# Patient Record
Sex: Male | Born: 1950 | Race: White | Hispanic: No | Marital: Married | Smoking: Never smoker
Health system: Southern US, Community
[De-identification: ages and names within clinical notes are randomized; demographics above are authoritative.]

## PROBLEM LIST (undated history)

## (undated) DIAGNOSIS — E785 Hyperlipidemia, unspecified: Secondary | ICD-10-CM

## (undated) DIAGNOSIS — Z79899 Other long term (current) drug therapy: Secondary | ICD-10-CM

## (undated) DIAGNOSIS — E559 Vitamin D deficiency, unspecified: Secondary | ICD-10-CM

## (undated) DIAGNOSIS — I4891 Unspecified atrial fibrillation: Secondary | ICD-10-CM

## (undated) DIAGNOSIS — L570 Actinic keratosis: Secondary | ICD-10-CM

## (undated) DIAGNOSIS — R5383 Other fatigue: Secondary | ICD-10-CM

## (undated) DIAGNOSIS — Z7901 Long term (current) use of anticoagulants: Secondary | ICD-10-CM

## (undated) DIAGNOSIS — E669 Obesity, unspecified: Secondary | ICD-10-CM

## (undated) DIAGNOSIS — E349 Endocrine disorder, unspecified: Secondary | ICD-10-CM

## (undated) DIAGNOSIS — E66812 Obesity, class 2: Secondary | ICD-10-CM

## (undated) DIAGNOSIS — N529 Male erectile dysfunction, unspecified: Secondary | ICD-10-CM

## (undated) DIAGNOSIS — G44209 Tension-type headache, unspecified, not intractable: Secondary | ICD-10-CM

## (undated) HISTORY — PX: NASAL SEPTUM SURGERY: SHX37

## (undated) HISTORY — DX: Vitamin D deficiency, unspecified: E55.9

## (undated) HISTORY — DX: Endocrine disorder, unspecified: E34.9

## (undated) HISTORY — DX: Long term (current) use of anticoagulants: Z79.01

## (undated) HISTORY — DX: Other long term (current) drug therapy: Z79.899

## (undated) HISTORY — DX: Hyperlipidemia, unspecified: E78.5

## (undated) HISTORY — DX: Actinic keratosis: L57.0

## (undated) HISTORY — DX: Unspecified atrial fibrillation: I48.91

## (undated) HISTORY — DX: Obesity, unspecified: E66.9

## (undated) HISTORY — DX: Male erectile dysfunction, unspecified: N52.9

## (undated) HISTORY — DX: Obesity, class 2: E66.812

## (undated) HISTORY — DX: Other fatigue: R53.83

## (undated) HISTORY — DX: Tension-type headache, unspecified, not intractable: G44.209

---

## 2007-03-08 DIAGNOSIS — I482 Chronic atrial fibrillation, unspecified: Secondary | ICD-10-CM

## 2012-09-23 LAB — HM COLONOSCOPY: HM COLON: NORMAL

## 2013-12-16 LAB — LIPID PANEL
Cholesterol: 170 mg/dL (ref 0–200)
HDL: 42 mg/dL (ref 35–70)
LDL Cholesterol: 110 mg/dL
TRIGLYCERIDES: 89 mg/dL (ref 40–160)

## 2015-03-14 ENCOUNTER — Encounter: Payer: Self-pay | Admitting: Family Medicine

## 2015-03-14 DIAGNOSIS — Z5181 Encounter for therapeutic drug level monitoring: Secondary | ICD-10-CM | POA: Insufficient documentation

## 2015-03-14 DIAGNOSIS — E538 Deficiency of other specified B group vitamins: Secondary | ICD-10-CM | POA: Insufficient documentation

## 2015-03-14 DIAGNOSIS — E875 Hyperkalemia: Secondary | ICD-10-CM | POA: Insufficient documentation

## 2015-03-14 DIAGNOSIS — N529 Male erectile dysfunction, unspecified: Secondary | ICD-10-CM | POA: Insufficient documentation

## 2015-03-14 DIAGNOSIS — E291 Testicular hypofunction: Secondary | ICD-10-CM | POA: Insufficient documentation

## 2015-03-14 DIAGNOSIS — E785 Hyperlipidemia, unspecified: Secondary | ICD-10-CM | POA: Insufficient documentation

## 2015-03-14 DIAGNOSIS — E669 Obesity, unspecified: Secondary | ICD-10-CM | POA: Insufficient documentation

## 2015-03-14 DIAGNOSIS — E559 Vitamin D deficiency, unspecified: Secondary | ICD-10-CM | POA: Insufficient documentation

## 2015-03-14 DIAGNOSIS — G44209 Tension-type headache, unspecified, not intractable: Secondary | ICD-10-CM | POA: Insufficient documentation

## 2015-03-14 DIAGNOSIS — D229 Melanocytic nevi, unspecified: Secondary | ICD-10-CM | POA: Insufficient documentation

## 2015-03-15 ENCOUNTER — Encounter: Payer: Self-pay | Admitting: Family Medicine

## 2015-03-15 ENCOUNTER — Ambulatory Visit (INDEPENDENT_AMBULATORY_CARE_PROVIDER_SITE_OTHER): Payer: Managed Care, Other (non HMO) | Admitting: Family Medicine

## 2015-03-15 VITALS — BP 124/86 | HR 83 | Temp 98.4°F | Resp 18 | Ht 69.5 in | Wt 247.2 lb

## 2015-03-15 DIAGNOSIS — F39 Unspecified mood [affective] disorder: Secondary | ICD-10-CM | POA: Diagnosis not present

## 2015-03-15 DIAGNOSIS — J40 Bronchitis, not specified as acute or chronic: Secondary | ICD-10-CM

## 2015-03-15 DIAGNOSIS — F338 Other recurrent depressive disorders: Secondary | ICD-10-CM

## 2015-03-15 DIAGNOSIS — E538 Deficiency of other specified B group vitamins: Secondary | ICD-10-CM | POA: Diagnosis not present

## 2015-03-15 DIAGNOSIS — I480 Paroxysmal atrial fibrillation: Secondary | ICD-10-CM

## 2015-03-15 DIAGNOSIS — E669 Obesity, unspecified: Secondary | ICD-10-CM

## 2015-03-15 MED ORDER — BENZONATATE 200 MG PO CAPS: 200.0000 mg | ORAL_CAPSULE | Freq: Three times a day (TID) | ORAL | Status: DC

## 2015-03-15 MED ORDER — BUPROPION HCL ER (XL) 300 MG PO TB24: 300.0000 mg | ORAL_TABLET | Freq: Every day | ORAL | Status: DC

## 2015-03-15 NOTE — Patient Instructions (Signed)
Bupropion tablets (Depression/Mood Disorders) What is this medicine? BUPROPION (byoo PROE pee on) is used to treat depression. This medicine may be used for other purposes; ask your health care provider or pharmacist if you have questions. COMMON BRAND NAME(S): Wellbutrin What should I tell my health care provider before I take this medicine? They need to know if you have any of these conditions: -an eating disorder, such as anorexia or bulimia -bipolar disorder or psychosis -diabetes or high blood sugar, treated with medication -glaucoma -heart disease, previous heart attack, or irregular heart beat -head injury or brain tumor -high blood pressure -kidney or liver disease -seizures -suicidal thoughts or a previous suicide attempt -Tourette's syndrome -weight loss -an unusual or allergic reaction to bupropion, other medicines, foods, dyes, or preservatives -breast-feeding -pregnant or trying to become pregnant How should I use this medicine? Take this medicine by mouth with a glass of water. Follow the directions on the prescription label. You can take it with or without food. If it upsets your stomach, take it with food. Take your medicine at regular intervals. Do not take your medicine more often than directed. Do not stop taking this medicine suddenly except upon the advice of your doctor. Stopping this medicine too quickly may cause serious side effects or your condition may worsen. A special MedGuide will be given to you by the pharmacist with each prescription and refill. Be sure to read this information carefully each time. Talk to your pediatrician regarding the use of this medicine in children. Special care may be needed. Overdosage: If you think you have taken too much of this medicine contact a poison control center or emergency room at once. NOTE: This medicine is only for you. Do not share this medicine with others. What if I miss a dose? If you miss a dose, take it as soon  as you can. If it is less than four hours to your next dose, take only that dose and skip the missed dose. Do not take double or extra doses. What may interact with this medicine? Do not take this medicine with any of the following medications: -linezolid -MAOIs like Azilect, Carbex, Eldepryl, Marplan, Nardil, and Parnate -methylene blue (injected into a vein) -other medicines that contain bupropion like Zyban Acute Bronchitis Bronchitis is inflammation of the airways that extend from the windpipe into the lungs (bronchi). The inflammation often causes mucus to develop. This leads to a cough, which is the most common symptom of bronchitis.  In acute bronchitis, the condition usually develops suddenly and goes away over time, usually in a couple weeks. Smoking, allergies, and asthma can make bronchitis worse. Repeated episodes of bronchitis may cause further lung problems.  CAUSES Acute bronchitis is most often caused by the same virus that causes a cold. The virus can spread from person to person (contagious) through coughing, sneezing, and touching contaminated objects. SIGNS AND SYMPTOMS   Cough.   Fever.   Coughing up mucus.   Body aches.   Chest congestion.   Chills.   Shortness of breath.   Sore throat.  DIAGNOSIS  Acute bronchitis is usually diagnosed through a physical exam. Your health care provider will also ask you questions about your medical history. Tests, such as chest X-rays, are sometimes done to rule out other conditions.  TREATMENT  Acute bronchitis usually goes away in a couple weeks. Oftentimes, no medical treatment is necessary. Medicines are sometimes given for relief of fever or cough. Antibiotic medicines are usually not needed but may  be prescribed in certain situations. In some cases, an inhaler may be recommended to help reduce shortness of breath and control the cough. A cool mist vaporizer may also be used to help thin bronchial secretions and make  it easier to clear the chest.  HOME CARE INSTRUCTIONS  Get plenty of rest.   Drink enough fluids to keep your urine clear or pale yellow (unless you have a medical condition that requires fluid restriction). Increasing fluids may help thin your respiratory secretions (sputum) and reduce chest congestion, and it will prevent dehydration.   Take medicines only as directed by your health care provider.  If you were prescribed an antibiotic medicine, finish it all even if you start to feel better.  Avoid smoking and secondhand smoke. Exposure to cigarette smoke or irritating chemicals will make bronchitis worse. If you are a smoker, consider using nicotine gum or skin patches to help control withdrawal symptoms. Quitting smoking will help your lungs heal faster.   Reduce the chances of another bout of acute bronchitis by washing your hands frequently, avoiding people with cold symptoms, and trying not to touch your hands to your mouth, nose, or eyes.   Keep all follow-up visits as directed by your health care provider.  SEEK MEDICAL CARE IF: Your symptoms do not improve after 1 week of treatment.  SEEK IMMEDIATE MEDICAL CARE IF:  You develop an increased fever or chills.   You have chest pain.   You have severe shortness of breath.  You have bloody sputum.   You develop dehydration.  You faint or repeatedly feel like you are going to pass out.  You develop repeated vomiting.  You develop a severe headache. MAKE SURE YOU:   Understand these instructions.  Will watch your condition.  Will get help right away if you are not doing well or get worse. Document Released: 10/23/2004 Document Revised: 01/30/2014 Document Reviewed: 03/08/2013 [facility] Patient Information 2015 [place], [place]. This information is not intended to replace advice given to you by your health care provider. Make sure you discuss any questions you have with your health care provider.

## 2015-03-15 NOTE — Progress Notes (Signed)
Name: James Newman   MRN: [id number]    DOB: 1950-09-30   Date:03/15/2015       Progress Note  Subjective  Chief Complaint  Chief Complaint  Patient presents with  . Nasal Congestion    Patient went to [facility] and was given Amoxicillin, Prednisone,  Albuterol syrup, and Flonase on Friday March 09, 2015   . Cough    Onset- 3 weeks, yellow mucus, worst when lays down.    HPI  URI: developed nasal congestion, postnasal drainage, productive cough. Symptoms started a few weeks ago.  He went to [facility] on June 10th, 2016,  he took Prednisone, flonase, albuterol syrup and over the weekend cough got worse so he filled antibiotics 48 hours ago. He is feeling better overall, but he came in to make sure he is okay.   Lack of energy: here was doing well on Androgel, but HCT was up and had to stop it, he gained weight and last fall started to have lack of energy and motivation. He was taking Wellbutrin and helped him, he wants a refill  Afib: no chest pain, no palpitation, taking coumadin and is doing well, rhythm controlled  Patient Active Problem List   Diagnosis Date Noted  . Encounter for therapeutic drug level monitoring 03/14/2015  . 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 B12 deficiency 03/14/2015  . Dyslipidemia 03/14/2015  . 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 Obesity (BMI 30-39.9) 03/14/2015  . Vitamin D deficiency 03/14/2015  . A-fib 03/08/2007    Past Surgical History  Procedure Laterality Date  . Nasal septum surgery      Family History  Problem Relation Age of Onset  . COPD Mother   . Hypertension Son   . Obesity Son     History   Social History  . Marital Status: Married    Spouse Name: N/A  . Number of Children: N/A  . Years of Education: N/A   Occupational History  . Not on file.   Social History Main Topics  . Smoking status: Never Smoker   . Smokeless tobacco: Never Used   . Alcohol Use: 1.2 oz/week    2 Standard drinks or equivalent per week  . Drug Use: No  . Sexual Activity:    Partners: Female   Other Topics Concern  . Not on file   Social History Narrative     Current outpatient prescriptions:  .  albuterol (PROVENTIL,VENTOLIN) 2 MG/5ML syrup, Take 2 mg by mouth 4 (four) times daily as needed., Disp: , Rfl:  .  amoxicillin (AMOXIL) 500 MG capsule, Take 1 capsule by mouth 3 (three) times daily., Disp: , Rfl:  .  buPROPion (WELLBUTRIN XL) 300 MG 24 hr tablet, Take 1 tablet by mouth daily., Disp: , Rfl:  .  Cyanocobalamin (B-12) 1000 MCG SUBL, Place 1 tablet under the tongue daily., Disp: , Rfl:  .  fluticasone (FLONASE) 50 MCG/ACT nasal spray, Place 2 sprays into both nostrils as needed., Disp: , Rfl:  .  warfarin (COUMADIN) 5 MG tablet, Take 1 tablet by mouth daily., Disp: , Rfl:   No Known Allergies   ROS  Constitutional: Negative for fever but has  weight change.  Respiratory: positive for cough no shortness of breath.   Cardiovascular: Negative for chest pain or palpitations.  Gastrointestinal: Negative for abdominal pain, no bowel changes.  Musculoskeletal: Negative for gait problem or joint swelling.  Skin: Negative for rash.  Neurological: Negative for dizziness or headache.  No other specific complaints in a complete review of systems (except as listed in HPI above).  Objective  Filed Vitals:   03/15/15 1018  BP: 124/86  Pulse: 83  Temp: 98.4 F (36.9 C)  TempSrc: Oral  Resp: 18  Height: 5' 9.5" (1.765 m)  Weight: 247 lb 3.2 oz (112.129 kg)  SpO2: 98%    Body mass index is 35.99 kg/(m^2).  Physical Exam  Constitutional: Patient appears well-developed and well-nourished. No distress.  Eyes:  No scleral icterus.  Neck: Normal range of motion. Neck supple. Head atraumatic, normocephalic Ears : within normal limits Cardiovascular: irregular rate and rhythm  No murmur heard. No BLE edema. Pulmonary/Chest: Effort  normal and breath sounds normal. No respiratory distress. Abdominal: Soft.  There is no tenderness. Psychiatric: Patient has a normal mood and affect. behavior is normal. Judgment and thought content normal.  PHQ2/9: Depression screen PHQ 2/9 03/15/2015  Decreased Interest 0  Down, Depressed, Hopeless 0  PHQ - 2 Score 0     Fall Risk: Fall Risk  03/15/2015  Falls in the past year? No    Assessment & Plan  1. Bronchitis Continue all medications, add cough suppressant - benzonatate (TESSALON) 200 MG capsule; Take 1 capsule (200 mg total) by mouth 3 (three) times daily.  Dispense: 15 capsule; Refill: 45  2. Paroxysmal atrial fibrillation  Stop albuterol syrup because of afib  3. B12 deficiency Taking supplementation  4. Obesity (BMI 30-39.9) Gained weight since stopped Androgel one year ago  5. Seasonal affective disorder  - buPROPion (WELLBUTRIN XL) 300 MG 24 hr tablet; Take 1 tablet (300 mg total) by mouth daily.  Dispense: 30 tablet; Refill: 5

## 2015-05-15 ENCOUNTER — Other Ambulatory Visit: Payer: Self-pay

## 2015-05-15 NOTE — Telephone Encounter (Signed)
Patient requesting refill. 

## 2015-05-16 MED ORDER — WARFARIN SODIUM 5 MG PO TABS: 5.0000 mg | ORAL_TABLET | Freq: Every day | ORAL | Status: DC

## 2015-06-08 ENCOUNTER — Other Ambulatory Visit: Payer: Self-pay | Admitting: Family Medicine

## 2015-06-09 LAB — PROTIME-INR
INR: 2.2 — ABNORMAL HIGH (ref 0.8–1.2)
PROTHROMBIN TIME: 22.9 s — AB (ref 9.1–12.0)

## 2015-06-11 NOTE — Progress Notes (Signed)
Patient notified

## 2015-08-22 ENCOUNTER — Telehealth: Payer: Self-pay | Admitting: Family Medicine

## 2015-08-22 NOTE — Telephone Encounter (Signed)
Patient scheduled his annual cpe for Wednesday 08-29-15. He would like to pick up a lab order on Monday 08-27-15 that way the test results will be in on his physical date.

## 2015-08-23 ENCOUNTER — Other Ambulatory Visit: Payer: Self-pay | Admitting: Family Medicine

## 2015-08-23 DIAGNOSIS — E538 Deficiency of other specified B group vitamins: Secondary | ICD-10-CM

## 2015-08-23 DIAGNOSIS — Z5181 Encounter for therapeutic drug level monitoring: Secondary | ICD-10-CM

## 2015-08-23 DIAGNOSIS — N529 Male erectile dysfunction, unspecified: Secondary | ICD-10-CM

## 2015-08-23 DIAGNOSIS — E785 Hyperlipidemia, unspecified: Secondary | ICD-10-CM

## 2015-08-23 DIAGNOSIS — E559 Vitamin D deficiency, unspecified: Secondary | ICD-10-CM

## 2015-08-23 NOTE — Telephone Encounter (Signed)
orderd

## 2015-08-29 ENCOUNTER — Ambulatory Visit (INDEPENDENT_AMBULATORY_CARE_PROVIDER_SITE_OTHER): Payer: Managed Care, Other (non HMO) | Admitting: Family Medicine

## 2015-08-29 ENCOUNTER — Other Ambulatory Visit: Payer: Self-pay | Admitting: Family Medicine

## 2015-08-29 ENCOUNTER — Encounter: Payer: Self-pay | Admitting: Family Medicine

## 2015-08-29 VITALS — BP 122/84 | HR 82 | Temp 97.6°F | Resp 14 | Ht 72.0 in | Wt 246.5 lb

## 2015-08-29 DIAGNOSIS — Z719 Counseling, unspecified: Secondary | ICD-10-CM

## 2015-08-29 DIAGNOSIS — Z0000 Encounter for general adult medical examination without abnormal findings: Secondary | ICD-10-CM

## 2015-08-29 DIAGNOSIS — Z7189 Other specified counseling: Secondary | ICD-10-CM | POA: Diagnosis not present

## 2015-08-29 DIAGNOSIS — Z125 Encounter for screening for malignant neoplasm of prostate: Secondary | ICD-10-CM

## 2015-08-29 NOTE — Progress Notes (Signed)
Name: James Newman   MRN: [id number]    DOB: April 19, 1951   Date:08/29/2015       Progress Note  Subjective  Chief Complaint  Chief Complaint  Patient presents with  . Annual Exam    HPI  Male Exam: he continues to have fatigue and weight gain since stopped taking Testosterone a couple of years ago because HCT was elevated, he wants to have labs rechecked and reconsider going back on medication. He is taking Vitamin B12 and Vitamin D otc , levels were very low in January 2016 He denies any symptoms of BPH such as weak urinary stream, dribbling or nocturia   Patient Active Problem List   Diagnosis Date Noted  . Encounter for therapeutic drug level monitoring 03/14/2015  . 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 B12 deficiency 03/14/2015  . Dyslipidemia 03/14/2015  . 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 Obesity (BMI 30-39.9) 03/14/2015  . Vitamin D deficiency 03/14/2015  . A-fib ([place]) 03/08/2007    Past Surgical History  Procedure Laterality Date  . Nasal septum surgery      Family History  Problem Relation Age of Onset  . COPD Mother   . Hypertension Son   . Obesity Son     Social History   Social History  . Marital Status: Married    Spouse Name: N/A  . Number of Children: N/A  . Years of Education: N/A   Occupational History  . Not on file.   Social History Main Topics  . Smoking status: Never Smoker   . Smokeless tobacco: Never Used  . Alcohol Use: 1.2 oz/week    2 Standard drinks or equivalent per week  . Drug Use: No  . Sexual Activity:    Partners: Female   Other Topics Concern  . Not on file   Social History Narrative     Current outpatient prescriptions:  .  cholecalciferol (VITAMIN D) 1000 UNITS tablet, Take 1,000 Units by mouth daily., Disp: , Rfl:  .  Cyanocobalamin (B-12) 1000 MCG SUBL, Place 1 tablet under the tongue daily., Disp: , Rfl:  .  warfarin (COUMADIN) 5 MG  tablet, Take 1 tablet (5 mg total) by mouth daily. Take 1 tablet by mouth daily and 1& 1/2 tablets on Tuesday and Thursday, Disp: 45 tablet, Rfl: 5  No Known Allergies   ROS  Constitutional: Negative for fever or weight change.  Respiratory: Negative for cough and shortness of breath.   Cardiovascular: Negative for chest pain or palpitations.  Gastrointestinal: Negative for abdominal pain, no bowel changes.  Musculoskeletal: Negative for gait problem or joint swelling.  Skin: Negative for rash.  Neurological: Negative for dizziness or headache.  No other specific complaints in a complete review of systems (except as listed in HPI above).   Objective  Filed Vitals:   08/29/15 0857  BP: 122/84  Pulse: 82  Temp: 97.6 F (36.4 C)  TempSrc: Oral  Resp: 14  Height: 6' (1.829 m)  Weight: 246 lb 8 oz (111.812 kg)  SpO2: 97%    Body mass index is 33.42 kg/(m^2).  Physical Exam  Constitutional: Patient appears well-developed and well-nourished. No distress.  HENT: Head: Normocephalic and atraumatic. Ears: B TMs ok, no erythema or effusion; Nose: Nose normal. Mouth/Throat: Oropharynx is clear and moist. No oropharyngeal exudate.  Eyes: Conjunctivae and EOM are normal. Pupils are equal, round, and reactive to light. No scleral icterus.  Neck: Normal range of  motion. Neck supple. No JVD present. No thyromegaly present.  Cardiovascular: irregular rate and  rhythm and normal heart sounds.  No murmur heard. No BLE edema. Pulmonary/Chest: Effort normal and breath sounds normal. No respiratory distress. Abdominal: Soft. Bowel sounds are normal, no distension. There is no tenderness. no masses MALE GENITALIA: Normal descended testes bilaterally, no masses palpated, no hernias, no lesions, no discharge RECTAL: patient did not want to have it done this year Musculoskeletal: Normal range of motion, no joint effusions. No gross deformities Neurological: he is alert and oriented to person,  place, and time. No cranial nerve deficit. Coordination, balance, strength, speech and gait are normal.  Skin: Skin is warm and very dry, pilling on his face, also thick toenails, and hyperpigmentation on both anterior legs Psychiatric: Patient has a normal mood and affect. behavior is normal. Judgment and thought content normal.  Recent Results (from the past 2160 hour(s))  Protime-INR     Status: Abnormal   Collection Time: 06/08/15  2:41 PM  Result Value Ref Range   INR 2.2 (H) 0.8 - 1.2    Comment: Reference interval is for non-anticoagulated patients. Suggested INR therapeutic range for Vitamin K antagonist therapy:    Standard Dose (moderate intensity                   therapeutic range):       2.0 - 3.0    Higher intensity therapeutic range       2.5 - 3.5    Prothrombin Time 22.9 (H) 9.1 - 12.0 sec    PHQ2/9: Depression screen [facility] 2/9 08/29/2015 03/15/2015  Decreased Interest 0 0  Down, Depressed, Hopeless 0 0  PHQ - 2 Score 0 0    Fall Risk: Fall Risk  08/29/2015 03/15/2015  Falls in the past year? No No    Functional Status Survey: Is the patient deaf or have difficulty hearing?: No Does the patient have difficulty seeing, even when wearing glasses/contacts?: No Does the patient have difficulty concentrating, remembering, or making decisions?: No Does the patient have difficulty walking or climbing stairs?: No Does the patient have difficulty dressing or bathing?: No Does the patient have difficulty doing errands alone such as visiting a doctor's office or shopping?: No    Assessment & Plan  1. Encounter for routine history and physical exam for male   2. Prostate cancer screening  Discussed USPTF but he would like to have PSA this year - PSA  3. Health counseling  Discussed importance of 150 minutes of physical activity weekly, eat two servings of fish weekly, eat one serving of tree nuts ( cashews, pistachios, pecans, almonds.Marland Kitchen) every other day, eat 6  servings of fruit/vegetables daily and drink plenty of water and avoid sweet beverages.

## 2015-08-30 ENCOUNTER — Other Ambulatory Visit: Payer: Self-pay | Admitting: Family Medicine

## 2015-08-30 ENCOUNTER — Telehealth: Payer: Self-pay

## 2015-08-30 DIAGNOSIS — E291 Testicular hypofunction: Secondary | ICD-10-CM

## 2015-08-30 LAB — LIPID PANEL
CHOL/HDL RATIO: 4 ratio (ref 0.0–5.0)
Cholesterol, Total: 206 mg/dL — ABNORMAL HIGH (ref 100–199)
HDL: 52 mg/dL (ref >39)
LDL CALC: 133 mg/dL — AB (ref 0–99)
Triglycerides: 107 mg/dL (ref 0–149)
VLDL Cholesterol Cal: 21 mg/dL (ref 5–40)

## 2015-08-30 LAB — COMPREHENSIVE METABOLIC PANEL
ALBUMIN: 4.4 g/dL (ref 3.6–4.8)
ALT: 17 IU/L (ref 0–44)
AST: 22 IU/L (ref 0–40)
Albumin/Globulin Ratio: 1.6 (ref 1.1–2.5)
Alkaline Phosphatase: 54 IU/L (ref 39–117)
BUN/Creatinine Ratio: 15 (ref 10–22)
BUN: 16 mg/dL (ref 8–27)
Bilirubin Total: 1.2 mg/dL (ref 0.0–1.2)
CALCIUM: 9.8 mg/dL (ref 8.6–10.2)
CO2: 24 mmol/L (ref 18–29)
CREATININE: 1.06 mg/dL (ref 0.76–1.27)
Chloride: 100 mmol/L (ref 97–106)
GFR, EST AFRICAN AMERICAN: 87 mL/min/{1.73_m2} (ref >59)
GFR, EST NON AFRICAN AMERICAN: 75 mL/min/{1.73_m2} (ref >59)
GLOBULIN, TOTAL: 2.8 g/dL (ref 1.5–4.5)
Glucose: 98 mg/dL (ref 65–99)
Potassium: 5.8 mmol/L — ABNORMAL HIGH (ref 3.5–5.2)
SODIUM: 139 mmol/L (ref 136–144)
TOTAL PROTEIN: 7.2 g/dL (ref 6.0–8.5)

## 2015-08-30 LAB — CBC
Hematocrit: 47.1 % (ref 37.5–51.0)
Hemoglobin: 16.2 g/dL (ref 12.6–17.7)
MCH: 30.3 pg (ref 26.6–33.0)
MCHC: 34.4 g/dL (ref 31.5–35.7)
MCV: 88 fL (ref 79–97)
PLATELETS: 174 10*3/uL (ref 150–379)
RBC: 5.34 x10E6/uL (ref 4.14–5.80)
RDW: 13.8 % (ref 12.3–15.4)
WBC: 7.2 10*3/uL (ref 3.4–10.8)

## 2015-08-30 LAB — VITAMIN D 25 HYDROXY (VIT D DEFICIENCY, FRACTURES): VIT D 25 HYDROXY: 22.1 ng/mL — AB (ref 30.0–100.0)

## 2015-08-30 LAB — PROTIME-INR
INR: 1.2 (ref 0.8–1.2)
PROTHROMBIN TIME: 12.8 s — AB (ref 9.1–12.0)

## 2015-08-30 LAB — PSA: Prostate Specific Ag, Serum: 1.4 ng/mL (ref 0.0–4.0)

## 2015-08-30 LAB — TESTOSTERONE: Testosterone: 323 ng/dL — ABNORMAL LOW (ref 348–1197)

## 2015-08-30 LAB — VITAMIN B12: Vitamin B-12: 744 pg/mL (ref 211–946)

## 2015-08-30 MED ORDER — TESTOSTERONE 12.5 MG/ACT (1%) TD GEL: 50.0000 mg | Freq: Every day | TRANSDERMAL | Status: DC

## 2015-09-07 NOTE — Telephone Encounter (Signed)
Open in erro 

## 2015-10-29 ENCOUNTER — Other Ambulatory Visit: Payer: Self-pay | Admitting: Family Medicine

## 2015-10-29 NOTE — Telephone Encounter (Signed)
Patient requesting refill. 

## 2015-11-22 ENCOUNTER — Encounter: Payer: Self-pay | Admitting: Family Medicine

## 2015-11-22 ENCOUNTER — Ambulatory Visit (INDEPENDENT_AMBULATORY_CARE_PROVIDER_SITE_OTHER): Payer: Managed Care, Other (non HMO) | Admitting: Family Medicine

## 2015-11-22 VITALS — BP 128/80 | HR 97 | Temp 97.7°F | Resp 16 | Ht 72.0 in | Wt 255.1 lb

## 2015-11-22 DIAGNOSIS — E785 Hyperlipidemia, unspecified: Secondary | ICD-10-CM

## 2015-11-22 DIAGNOSIS — I480 Paroxysmal atrial fibrillation: Secondary | ICD-10-CM | POA: Diagnosis not present

## 2015-11-22 DIAGNOSIS — E538 Deficiency of other specified B group vitamins: Secondary | ICD-10-CM

## 2015-11-22 DIAGNOSIS — E669 Obesity, unspecified: Secondary | ICD-10-CM | POA: Diagnosis not present

## 2015-11-22 DIAGNOSIS — E291 Testicular hypofunction: Secondary | ICD-10-CM

## 2015-11-22 DIAGNOSIS — F39 Unspecified mood [affective] disorder: Secondary | ICD-10-CM | POA: Diagnosis not present

## 2015-11-22 DIAGNOSIS — D692 Other nonthrombocytopenic purpura: Secondary | ICD-10-CM

## 2015-11-22 DIAGNOSIS — Z79899 Other long term (current) drug therapy: Secondary | ICD-10-CM

## 2015-11-22 DIAGNOSIS — F338 Other recurrent depressive disorders: Secondary | ICD-10-CM | POA: Insufficient documentation

## 2015-11-22 NOTE — Progress Notes (Addendum)
Name: James Newman   MRN: [id number]    DOB: [birth year]   Date:11/22/2015       Progress Note  Subjective  Chief Complaint  Chief Complaint  Patient presents with  . Follow-up    patient is here for his 63-month f/u  . 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 He states testosterone seemed to have improved his energy level also.   Hypogonadism: he has been doing well on Testosterone gel, more energy, we will recheck since he has history of elevated of hgb while of medication in the past. Denies symptoms of aggression. Good level of energy. No libido - but states his wife has been sick lately also.  Afib: paroxysmal. He has been taking coumadin, denies easy bleeding. He is compliant with having labs done. No recent episodes of SOB or palpitation.   Vitamin D and B12: taking otc supplementation   Patient Active Problem List   Diagnosis Date Noted  . Seasonal affective disorder ([place]) 11/22/2015  . Encounter for therapeutic drug level monitoring 03/14/2015  . 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 B12 deficiency 03/14/2015  . Dyslipidemia 03/14/2015  . 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 Obesity (BMI 30-39.9) 03/14/2015  . Vitamin D deficiency 03/14/2015  . A-fib ([place]) 03/08/2007    Past Surgical History  Procedure Laterality Date  . Nasal septum surgery      Family History  Problem Relation Age of Onset  . COPD Mother   . Hypertension Son   . Obesity Son     Social History   Social History  . Marital Status: Married    Spouse Name: N/A  . Number of Children: N/A  . Years of Education: N/A   Occupational History  . Not on file.   Social History Main Topics  . Smoking status: Never Smoker   . Smokeless  tobacco: Never Used  . Alcohol Use: 1.2 oz/week    2 Standard drinks or equivalent per week  . Drug Use: No  . Sexual Activity:    Partners: Female   Other Topics Concern  . Not on file   Social History Narrative     Current outpatient prescriptions:  .  cholecalciferol (VITAMIN D) 1000 UNITS tablet, Take 2,000 Units by mouth daily., Disp: , Rfl:  .  Cyanocobalamin (B-12) 1000 MCG SUBL, Place 1 tablet under the tongue daily., Disp: , Rfl:  .  Testosterone 12.5 MG/ACT (1%) GEL, Place 50 mg onto the skin daily., Disp: 150 g, Rfl: 0 .  warfarin (COUMADIN) 5 MG tablet, Take 1 tablet (5 mg total) by mouth daily. Take 1 tablet by mouth daily and 1& 1/2 tablets on Tuesday and Thursday, Disp: 45 tablet, Rfl: 5  No Known Allergies   ROS  Constitutional: Negative for fever , positive for  weight change.  Respiratory: Negative for cough and shortness of breath.   Cardiovascular: Negative for chest pain or palpitations.  Gastrointestinal: Negative for abdominal pain, no bowel changes.  Musculoskeletal: Negative for gait problem or joint swelling.  Skin: Negative for rash.  Neurological: Negative for dizziness or headache.  No other specific complaints in a complete review of systems (except as listed in HPI above).  Objective  Filed Vitals:  11/22/15 0810  BP: 128/80  Pulse: 97  Temp: 97.7 F (36.5 C)  TempSrc: Oral  Resp: 16  Height: 6' (1.829 m)  Weight: 255 lb 1.6 oz (115.713 kg)  SpO2: 97%    Body mass index is 34.59 kg/(m^2).  Physical Exam  Constitutional: Patient appears well-developed and well-nourished. Obese No distress.  HEENT: head atraumatic, normocephalic, pupils equal and reactive to light, neck supple, throat within normal limits Cardiovascular:  Irregular rate and  rhythm and normal heart sounds.  No murmur heard. No BLE edema. Pulmonary/Chest: Effort normal and breath sounds normal. No respiratory distress. Abdominal: Soft.  There is no  tenderness. Psychiatric: Patient has a normal mood and affect. behavior is normal. Judgment and thought content normal. Skin: ecchymosis on left upper arms  Recent Results (from the past 2160 hour(s))  PSA     Status: None   Collection Time: 08/29/15  9:47 AM  Result Value Ref Range   Prostate Specific Ag, Serum 1.4 0.0 - 4.0 ng/mL    Comment: Roche ECLIA methodology. According to the American Urological Association, Serum PSA should decrease and remain at undetectable levels after radical prostatectomy. The AUA defines biochemical recurrence as an initial PSA value 0.2 ng/mL or greater followed by a subsequent confirmatory PSA value 0.2 ng/mL or greater. Values obtained with different assay methods or kits cannot be used interchangeably. Results cannot be interpreted as absolute evidence of the presence or absence of malignant disease.   Comprehensive metabolic panel     Status: Abnormal   Collection Time: 08/29/15  9:47 AM  Result Value Ref Range   Glucose 98 65 - 99 mg/dL   BUN 16 8 - 27 mg/dL   Creatinine, Ser 1.06 0.76 - 1.27 mg/dL   GFR calc non Af Amer 75 >59 mL/min/1.73   GFR calc Af Amer 87 >59 mL/min/1.73   BUN/Creatinine Ratio 15 10 - 22   Sodium 139 136 - 144 mmol/L    Comment: **Effective September 10, 2015 the reference interval**   for Sodium, Serum will be changing to:                                             134 - 144    Potassium 5.8 (H) 3.5 - 5.2 mmol/L   Chloride 100 97 - 106 mmol/L    Comment: **Effective September 10, 2015 the reference interval**   for Chloride, Serum will be changing to:                                              96 - 106    CO2 24 18 - 29 mmol/L   Calcium 9.8 8.6 - 10.2 mg/dL   Total Protein 7.2 6.0 - 8.5 g/dL   Albumin 4.4 3.6 - 4.8 g/dL   Globulin, Total 2.8 1.5 - 4.5 g/dL   Albumin/Globulin Ratio 1.6 1.1 - 2.5   Bilirubin Total 1.2 0.0 - 1.2 mg/dL   Alkaline Phosphatase 54 39 - 117 IU/L   AST 22 0 - 40 IU/L   ALT 17 0 -  44 IU/L  CBC     Status: None   Collection Time: 08/29/15  9:47 AM  Result Value Ref Range   WBC 7.2 3.4 -  10.8 x10E3/uL   RBC 5.34 4.14 - 5.80 x10E6/uL   Hemoglobin 16.2 12.6 - 17.7 g/dL   Hematocrit 47.1 37.5 - 51.0 %   MCV 88 79 - 97 fL   MCH 30.3 26.6 - 33.0 pg   MCHC 34.4 31.5 - 35.7 g/dL   RDW 13.8 12.3 - 15.4 %   Platelets 174 150 - 379 x10E3/uL  Lipid panel     Status: Abnormal   Collection Time: 08/29/15  9:47 AM  Result Value Ref Range   Cholesterol, Total 206 (H) 100 - 199 mg/dL   Triglycerides 107 0 - 149 mg/dL   HDL 52 >39 mg/dL   VLDL Cholesterol Cal 21 5 - 40 mg/dL   LDL Calculated 133 (H) 0 - 99 mg/dL   Chol/HDL Ratio 4.0 0.0 - 5.0 ratio units    Comment:                                   T. Chol/HDL Ratio                                             Men  Women                               1/2 Avg.Risk  3.4    3.3                                   Avg.Risk  5.0    4.4                                2X Avg.Risk  9.6    7.1                                3X Avg.Risk 23.4   11.0   Testosterone     Status: Abnormal   Collection Time: 08/29/15  9:47 AM  Result Value Ref Range   Testosterone 323 (L) 348 - 1197 ng/dL   Comment, Testosterone Comment     Comment: Adult male reference interval is based on a population of lean males up to 63 years old.   VITAMIN D 25 Hydroxy (Vit-D Deficiency, Fractures)     Status: Abnormal   Collection Time: 08/29/15  9:47 AM  Result Value Ref Range   Vit D, 25-Hydroxy 22.1 (L) 30.0 - 100.0 ng/mL    Comment: Vitamin D deficiency has been defined by the [place] practice guideline as a level of serum 25-OH vitamin D less than 20 ng/mL (1,2). The Endocrine Society went on to further define vitamin D insufficiency as a level between 21 and 29 ng/mL (2). 1. IOM (Institute of Medicine). 2010. Dietary reference    intakes for calcium and D. [place]: The    [employer]. 2.  Holick MF, [place], Bischoff-Ferrari HA, et al.    Evaluation, treatment, and prevention of vitamin D    deficiency: an Endocrine Society clinical practice    guideline. JCEM. 2011 Jul; 96(7):1911-30.  Vitamin B12     Status: None   Collection Time: 08/29/15  9:47 AM  Result Value Ref Range   Vitamin B-12 744 211 - 946 pg/mL  Protime-INR     Status: Abnormal   Collection Time: 08/29/15  9:52 AM  Result Value Ref Range   INR 1.2 0.8 - 1.2    Comment: Reference interval is for non-anticoagulated patients. Suggested INR therapeutic range for Vitamin K antagonist therapy:    Standard Dose (moderate intensity                   therapeutic range):       2.0 - 3.0    Higher intensity therapeutic range       2.5 - 3.5    Prothrombin Time 12.8 (H) 9.1 - 12.0 sec     PHQ2/9: Depression screen [facility] 2/9 11/22/2015 08/29/2015 03/15/2015  Decreased Interest 0 0 0  Down, Depressed, Hopeless 0 0 0  PHQ - 2 Score 0 0 0    Fall Risk: Fall Risk  11/22/2015 08/29/2015 03/15/2015  Falls in the past year? No No No    Functional Status Survey: Is the patient deaf or have difficulty hearing?: No Does the patient have difficulty seeing, even when wearing glasses/contacts?: No Does the patient have difficulty concentrating, remembering, or making decisions?: No Does the patient have difficulty walking or climbing stairs?: No Does the patient have difficulty dressing or bathing?: No Does the patient have difficulty doing errands alone such as visiting a doctor's office or shopping?: No    Assessment & Plan  1. Paroxysmal atrial fibrillation (HCC)  Doing well   2. B12 deficiency  Continue otc supplementation   3. Obesity (BMI 30-39.9)  Discussed with the patient the risk posed by an increased BMI. Discussed importance of portion control, calorie counting and at least 150 minutes of physical activity weekly. Avoid sweet beverages and drink more water. Eat at least 6 servings of fruit and  vegetables daily   4. Seasonal affective disorder ([place])  Doing better, advised to say outside as long as possible  5. Dyslipidemia  - Lipid panel  6. Testicular hypofunction  - Testosterone  7. Long-term use of high-risk medication  - CBC with Differential/Platelet - Comprehensive metabolic panel

## 2015-11-23 ENCOUNTER — Other Ambulatory Visit: Payer: Self-pay | Admitting: Family Medicine

## 2015-11-23 DIAGNOSIS — D72829 Elevated white blood cell count, unspecified: Secondary | ICD-10-CM

## 2015-11-23 DIAGNOSIS — D582 Other hemoglobinopathies: Secondary | ICD-10-CM

## 2015-11-23 DIAGNOSIS — Z7901 Long term (current) use of anticoagulants: Secondary | ICD-10-CM

## 2015-11-23 LAB — LIPID PANEL
CHOLESTEROL TOTAL: 187 mg/dL (ref 100–199)
Chol/HDL Ratio: 4.7 ratio units (ref 0.0–5.0)
HDL: 40 mg/dL (ref >39)
LDL Calculated: 129 mg/dL — ABNORMAL HIGH (ref 0–99)
Triglycerides: 90 mg/dL (ref 0–149)
VLDL CHOLESTEROL CAL: 18 mg/dL (ref 5–40)

## 2015-11-23 LAB — COMPREHENSIVE METABOLIC PANEL
ALBUMIN: 4 g/dL (ref 3.6–4.8)
ALT: 14 IU/L (ref 0–44)
AST: 19 IU/L (ref 0–40)
Albumin/Globulin Ratio: 1.3 (ref 1.1–2.5)
Alkaline Phosphatase: 46 IU/L (ref 39–117)
BILIRUBIN TOTAL: 1.3 mg/dL — AB (ref 0.0–1.2)
BUN / CREAT RATIO: 12 (ref 10–22)
BUN: 12 mg/dL (ref 8–27)
CO2: 24 mmol/L (ref 18–29)
Calcium: 9.5 mg/dL (ref 8.6–10.2)
Chloride: 102 mmol/L (ref 96–106)
Creatinine, Ser: 1.01 mg/dL (ref 0.76–1.27)
GFR calc non Af Amer: 79 mL/min/{1.73_m2} (ref >59)
GFR, EST AFRICAN AMERICAN: 92 mL/min/{1.73_m2} (ref >59)
Globulin, Total: 3 g/dL (ref 1.5–4.5)
Glucose: 95 mg/dL (ref 65–99)
Potassium: 5.1 mmol/L (ref 3.5–5.2)
Sodium: 142 mmol/L (ref 134–144)
TOTAL PROTEIN: 7 g/dL (ref 6.0–8.5)

## 2015-11-23 LAB — CBC WITH DIFFERENTIAL/PLATELET
BASOS: 0 %
Basophils Absolute: 0 10*3/uL (ref 0.0–0.2)
EOS (ABSOLUTE): 0.2 10*3/uL (ref 0.0–0.4)
EOS: 2 %
HEMATOCRIT: 53.5 % — AB (ref 37.5–51.0)
Hemoglobin: 18.1 g/dL — ABNORMAL HIGH (ref 12.6–17.7)
Immature Grans (Abs): 0 10*3/uL (ref 0.0–0.1)
Immature Granulocytes: 0 %
LYMPHS ABS: 2.1 10*3/uL (ref 0.7–3.1)
Lymphs: 19 %
MCH: 30.2 pg (ref 26.6–33.0)
MCHC: 33.8 g/dL (ref 31.5–35.7)
MCV: 89 fL (ref 79–97)
MONOS ABS: 0.8 10*3/uL (ref 0.1–0.9)
Monocytes: 8 %
NEUTROS ABS: 7.7 10*3/uL — AB (ref 1.4–7.0)
Neutrophils: 71 %
Platelets: 184 10*3/uL (ref 150–379)
RBC: 5.99 x10E6/uL — ABNORMAL HIGH (ref 4.14–5.80)
RDW: 14.4 % (ref 12.3–15.4)
WBC: 10.9 10*3/uL — ABNORMAL HIGH (ref 3.4–10.8)

## 2015-11-23 LAB — TESTOSTERONE: Testosterone: 1117 ng/dL (ref 348–1197)

## 2015-11-23 MED ORDER — TESTOSTERONE 12.5 MG/ACT (1%) TD GEL: 50.0000 mg | Freq: Every day | TRANSDERMAL | Status: DC

## 2016-02-05 ENCOUNTER — Other Ambulatory Visit: Payer: Self-pay

## 2016-02-05 MED ORDER — TESTOSTERONE 12.5 MG/ACT (1%) TD GEL: 50.0000 mg | Freq: Every day | TRANSDERMAL | Status: DC

## 2016-02-05 NOTE — Telephone Encounter (Signed)
Patient called wanting a refill and was not sure if Dr. Ancil Boozer will fill it without being seen and having labs first since his dosage had to be adjusted the last time. Patient was told that I would submit the request and if she refuses we will give him a call so he could get an appt. He said ok.

## 2016-02-11 ENCOUNTER — Encounter: Payer: Self-pay | Admitting: Family Medicine

## 2016-02-11 ENCOUNTER — Inpatient Hospital Stay
Admission: EM | Admit: 2016-02-11 | Discharge: 2016-02-13 | DRG: 308 | Disposition: A | Discharge status: Home / Self Care | Payer: Commercial Managed Care - HMO | Attending: Internal Medicine | Admitting: Internal Medicine

## 2016-02-11 ENCOUNTER — Encounter: Payer: Self-pay | Admitting: Medical Oncology

## 2016-02-11 ENCOUNTER — Emergency Department: Payer: Commercial Managed Care - HMO

## 2016-02-11 ENCOUNTER — Ambulatory Visit (INDEPENDENT_AMBULATORY_CARE_PROVIDER_SITE_OTHER): Payer: 59 | Admitting: Family Medicine

## 2016-02-11 ENCOUNTER — Inpatient Hospital Stay
Admit: 2016-02-11 | Discharge: 2016-02-11 | Disposition: A | Discharge status: Home / Self Care | Payer: Commercial Managed Care - HMO | Attending: Internal Medicine | Admitting: Internal Medicine

## 2016-02-11 VITALS — HR 187 | Temp 97.8°F | Resp 22 | Wt 260.8 lb

## 2016-02-11 DIAGNOSIS — Z825 Family history of asthma and other chronic lower respiratory diseases: Secondary | ICD-10-CM

## 2016-02-11 DIAGNOSIS — Z8249 Family history of ischemic heart disease and other diseases of the circulatory system: Secondary | ICD-10-CM

## 2016-02-11 DIAGNOSIS — I248 Other forms of acute ischemic heart disease: Secondary | ICD-10-CM | POA: Diagnosis present

## 2016-02-11 DIAGNOSIS — Z6835 Body mass index (BMI) 35.0-35.9, adult: Secondary | ICD-10-CM | POA: Diagnosis not present

## 2016-02-11 DIAGNOSIS — Z7901 Long term (current) use of anticoagulants: Secondary | ICD-10-CM

## 2016-02-11 DIAGNOSIS — I4891 Unspecified atrial fibrillation: Secondary | ICD-10-CM | POA: Diagnosis not present

## 2016-02-11 DIAGNOSIS — I482 Chronic atrial fibrillation, unspecified: Secondary | ICD-10-CM

## 2016-02-11 DIAGNOSIS — E669 Obesity, unspecified: Secondary | ICD-10-CM | POA: Diagnosis present

## 2016-02-11 DIAGNOSIS — I5041 Acute combined systolic (congestive) and diastolic (congestive) heart failure: Secondary | ICD-10-CM | POA: Diagnosis present

## 2016-02-11 DIAGNOSIS — N529 Male erectile dysfunction, unspecified: Secondary | ICD-10-CM | POA: Diagnosis present

## 2016-02-11 DIAGNOSIS — I501 Left ventricular failure: Secondary | ICD-10-CM

## 2016-02-11 DIAGNOSIS — E785 Hyperlipidemia, unspecified: Secondary | ICD-10-CM | POA: Diagnosis present

## 2016-02-11 LAB — COMPREHENSIVE METABOLIC PANEL
ALT: 23 U/L (ref 17–63)
AST: 27 U/L (ref 15–41)
Albumin: 3.7 g/dL (ref 3.5–5.0)
Alkaline Phosphatase: 42 U/L (ref 38–126)
Anion gap: 7 (ref 5–15)
BUN: 12 mg/dL (ref 6–20)
CHLORIDE: 106 mmol/L (ref 101–111)
CO2: 23 mmol/L (ref 22–32)
Calcium: 8.9 mg/dL (ref 8.9–10.3)
Creatinine, Ser: 0.97 mg/dL (ref 0.61–1.24)
Glucose, Bld: 108 mg/dL — ABNORMAL HIGH (ref 65–99)
POTASSIUM: 3.9 mmol/L (ref 3.5–5.1)
SODIUM: 136 mmol/L (ref 135–145)
Total Bilirubin: 3.2 mg/dL — ABNORMAL HIGH (ref 0.3–1.2)
Total Protein: 7 g/dL (ref 6.5–8.1)

## 2016-02-11 LAB — PROTIME-INR
INR: 3.46
PROTHROMBIN TIME: 34.1 s — AB (ref 11.4–15.0)

## 2016-02-11 LAB — CBC
HCT: 47.5 % (ref 40.0–52.0)
HEMOGLOBIN: 15.9 g/dL (ref 13.0–18.0)
MCH: 29.8 pg (ref 26.0–34.0)
MCHC: 33.5 g/dL (ref 32.0–36.0)
MCV: 89 fL (ref 80.0–100.0)
Platelets: 155 10*3/uL (ref 150–440)
RBC: 5.33 MIL/uL (ref 4.40–5.90)
RDW: 16.1 % — ABNORMAL HIGH (ref 11.5–14.5)
WBC: 9.7 10*3/uL (ref 3.8–10.6)

## 2016-02-11 LAB — TROPONIN I
TROPONIN I: 0.04 ng/mL — AB (ref <0.031)
TROPONIN I: 0.04 ng/mL — AB (ref <0.031)
Troponin I: 0.03 ng/mL (ref <0.031)
Troponin I: 0.04 ng/mL — ABNORMAL HIGH (ref <0.031)

## 2016-02-11 LAB — BRAIN NATRIURETIC PEPTIDE: B NATRIURETIC PEPTIDE 5: 320 pg/mL — AB (ref 0.0–100.0)

## 2016-02-11 LAB — TSH: TSH: 1.61 u[IU]/mL (ref 0.350–4.500)

## 2016-02-11 MED ORDER — WARFARIN SODIUM 1 MG PO TABS
4.0000 mg | ORAL_TABLET | Freq: Every day | ORAL | Status: DC
Start: 2016-02-12 — End: 2016-02-12

## 2016-02-11 MED ORDER — ONDANSETRON HCL 4 MG/2ML IJ SOLN
4.0000 mg | Freq: Four times a day (QID) | INTRAMUSCULAR | Status: DC | PRN
  Filled 2016-02-11: qty 2

## 2016-02-11 MED ORDER — ACETAMINOPHEN 325 MG PO TABS: 650.0000 mg | ORAL_TABLET | ORAL | Status: DC | PRN

## 2016-02-11 MED ORDER — SODIUM CHLORIDE 0.9% FLUSH: 3.0000 mL | INTRAVENOUS | Status: DC | PRN

## 2016-02-11 MED ORDER — SODIUM CHLORIDE 0.9% FLUSH
3.0000 mL | Freq: Two times a day (BID) | INTRAVENOUS | Status: DC
  Administered 2016-02-12 – 2016-02-13 (×2): 3 mL via INTRAVENOUS

## 2016-02-11 MED ORDER — DILTIAZEM HCL 25 MG/5ML IV SOLN
20.0000 mg | Freq: Once | INTRAVENOUS | Status: AC
  Administered 2016-02-11: 20 mg via INTRAVENOUS
  Filled 2016-02-11: qty 5

## 2016-02-11 MED ORDER — DILTIAZEM HCL 100 MG IV SOLR
5.0000 mg/h | INTRAVENOUS | Status: DC
  Administered 2016-02-11: 10 mg/h via INTRAVENOUS
  Administered 2016-02-11: 5 mg/h via INTRAVENOUS
  Administered 2016-02-12: 7.5 mg/h via INTRAVENOUS
  Administered 2016-02-12: 5 mg/h via INTRAVENOUS
  Filled 2016-02-11 (×3): qty 100

## 2016-02-11 MED ORDER — DILTIAZEM HCL ER COATED BEADS 240 MG PO CP24
240.0000 mg | ORAL_CAPSULE | Freq: Once | ORAL | Status: AC
  Administered 2016-02-11: 240 mg via ORAL
  Filled 2016-02-11: qty 1

## 2016-02-11 MED ORDER — WARFARIN - PHARMACIST DOSING INPATIENT
Freq: Every day | Status: DC
  Administered 2016-02-12: 18:00:00

## 2016-02-11 MED ORDER — DILTIAZEM LOAD VIA INFUSION
20.0000 mg | Freq: Once | INTRAVENOUS | Status: AC
  Administered 2016-02-11: 20 mg via INTRAVENOUS
  Filled 2016-02-11: qty 20

## 2016-02-11 MED ORDER — DILTIAZEM HCL 25 MG/5ML IV SOLN: 10.0000 mg | Freq: Once | INTRAVENOUS | Status: DC

## 2016-02-11 MED ORDER — SODIUM CHLORIDE 0.9 % IV SOLN: 250.0000 mL | INTRAVENOUS | Status: DC | PRN

## 2016-02-11 NOTE — ED Notes (Signed)
Lab called regarding add on PT- INR, will add on at this time.

## 2016-02-11 NOTE — Consult Note (Signed)
[facility] Consultation Note  Patient ID: James Newman, MRN: [id number], DOB/AGE: 12/23/1950 65 y.o. Admit date: 02/11/2016   Date of Consult: 02/11/2016 Primary Physician: Loistine Chance, MD Primary Cardiologist: [facility]  Chief Complaint:  Chief Complaint  Patient presents with  . Tachycardia   Reason for Consult: acute diastolic dysfunction heart failure from atrial fibrillation with rapid ventricular rate  HPI: 65 y.o. male with known persistent atrial fibrillation with apparent previously reasonable heart rate control on appropriate anticoagulation for further risk reduction in stroke with now atrial fibrillation with rapid ventricular rate causing shortness of breath with physical activity edema as well as shortness of breath increasing in frequency and intensity over the last several weeks now needing to seen in the emergency room. The patient has had no evidence of chest discomfort at this time but does have some mild PND and orthopnea. The patient has been on appropriate medication management for stroke risk with warfarin with no bleeding complications at this time. Atrial fibrillation has rapid ventricular rate not previously on medications and now needing some additional medication management. The patient does have a slight elevated BNP consistent with heart failure type symptoms and diastolic nature troponin 0000000 more consistent with demand ischemia rather than acute coronary syndrome  Past Medical History  Diagnosis Date  . Tension headache   . Encounter for long-term (current) use of medications   . Anticoagulated on Coumadin   . Hyperlipidemia   . Hypotestosteronemia   . Lacking in energy   . Obesity, Class II, BMI 35-39.9, no comorbidity ([place])   . Impotence of organic origin   . Vitamin D deficiency   . Atrial fibrillation with normal ventricular rate [facility])       Surgical History:  Past Surgical History  Procedure Laterality Date  . Nasal septum  surgery       Home Meds: Prior to Admission medications   Medication Sig Start Date End Date Taking? Authorizing Provider  warfarin (COUMADIN) 5 MG tablet Take 1 tablet (5 mg total) by mouth daily. Take 1 tablet by mouth daily and 1& 1/2 tablets on Tuesday and Thursday 05/16/15  Yes Steele Sizer, MD  Testosterone 12.5 MG/ACT (1%) GEL Place 50 mg onto the skin daily. 02/05/16   Steele Sizer, MD    Inpatient Medications:     Allergies: No Known Allergies  Social History   Social History  . Marital Status: Married    Spouse Name: N/A  . Number of Children: N/A  . Years of Education: N/A   Occupational History  . Not on file.   Social History Main Topics  . Smoking status: Never Smoker   . Smokeless tobacco: Never Used  . Alcohol Use: 1.2 oz/week    2 Standard drinks or equivalent per week  . Drug Use: No  . Sexual Activity:    Partners: Female   Other Topics Concern  . Not on file   Social History Narrative     Family History  Problem Relation Age of Onset  . COPD Mother   . Hypertension Son   . Obesity Son      Review of Systems Positive for Shortness of breath PND orthopnea Negative for: General:  chills, fever, night sweats or weight changes.  Cardiovascular: Positive for PND orthopnea negative for syncope dizziness  Dermatological skin lesions rashes Respiratory: Cough congestion Urologic: Frequent urination urination at night and hematuria Abdominal: negative for nausea, vomiting, diarrhea, bright red blood per rectum, melena, or hematemesis Neurologic:  negative for visual changes, and/or hearing changes  All other systems reviewed and are otherwise negative except as noted above.  Labs:  Recent Labs  02/11/16 0953  TROPONINI 0.04*   Lab Results  Component Value Date   WBC 9.7 02/11/2016   HGB 15.9 02/11/2016   HCT 47.5 02/11/2016   MCV 89.0 02/11/2016   PLT 155 02/11/2016    Recent Labs Lab 02/11/16 1033  NA 136  K 3.9  CL 106  CO2  23  BUN 12  CREATININE 0.97  CALCIUM 8.9  PROT 7.0  BILITOT 3.2*  ALKPHOS 42  ALT 23  AST 27  GLUCOSE 108*   Lab Results  Component Value Date   CHOL 187 11/22/2015   HDL 40 11/22/2015   LDLCALC 129* 11/22/2015   TRIG 90 11/22/2015   No results found for: DDIMER  Radiology/Studies:  Dg Chest Portable 1 View  02/11/2016  CLINICAL DATA:  Shortness breath for 2 days.  Tachycardia. EXAM: PORTABLE CHEST 1 VIEW COMPARISON:  None. FINDINGS: Portable AP view of the chest was obtained. The cardiac silhouette is mildly enlarged but may be related to the AP projection. Interstitial lung markings are mildly prominent. There is no focal airspace disease. Trachea is midline. Bone structures appear to be intact. Negative for a pneumothorax. IMPRESSION: Heart size is mildly prominent and there are prominent interstitial lung markings. Cannot exclude mild interstitial pulmonary edema. Electronically Signed   By: Markus Daft M.D.   On: 02/11/2016 10:17    EKG: Atrial fibrillation with rapid ventricular rate and nonspecific ST and T-wave changes  Weights: Filed Weights   02/11/16 0950  Weight: 260 lb (117.935 kg)     Physical Exam: Blood pressure 100/80, pulse 110, temperature 97.5 F (36.4 C), temperature source Oral, resp. rate 18, height 6' (1.829 m), weight 260 lb (117.935 kg), SpO2 94 %. Body mass index is 35.25 kg/(m^2). General: Well developed, well nourished, in no acute distress. Head eyes ears nose throat: Normocephalic, atraumatic, sclera non-icteric, no xanthomas, nares are without discharge. No apparent thyromegaly and/or mass  Lungs: Normal respiratory effort.  no wheezes, basilar rales, no rhonchi.  Heart: Irregular with normal S1 S2. no murmur gallop, no rub, PMI is normal size and placement, carotid upstroke normal without bruit, jugular venous pressure is normal Abdomen: Soft, non-tender, non-distended with normoactive bowel sounds. No hepatomegaly. No rebound/guarding. No  obvious abdominal masses. Abdominal aorta is normal size without bruit Extremities: Trace edema. no cyanosis, no clubbing, no ulcers  Peripheral : 2+ bilateral upper extremity pulses, 2+ bilateral femoral pulses, 2+ bilateral dorsal pedal pulse Neuro: Alert and oriented. No facial asymmetry. No focal deficit. Moves all extremities spontaneously. Musculoskeletal: Normal muscle tone without kyphosis Psych:  Responds to questions appropriately with a normal affect.    Assessment: 65 year old with atrial fibrillation with rapid ventricular rate causing diastolic dysfunction congestive heart failure and no current evidence of myocardial infarction with the minimal elevated troponin more consistent with demand ischemia  Plan: 1. Continue warfarin for further risk reduction in stroke with atrial fibrillation 2. Treatment of diastolic dysfunction with heart rate control including medication management to long-acting diltiazem 3. Echocardiogram for LV systolic dysfunction valvular heart disease 4. Further assessment of troponin levels to assess for continued demand ischemia versus myocardial infarction 5. Further adjustments of medication management for heart rate control as patient ambulates 6. Diuresis for mild diastolic dysfunction heart failure  Signed, Corey Skains M.D. [facility] 02/11/2016, 1:06 PM

## 2016-02-11 NOTE — Progress Notes (Signed)
Per MD Manuella Ghazi push 10 mg cardizem,, hold off on cardizem drip for low SBP (100)

## 2016-02-11 NOTE — Progress Notes (Signed)
MD Anselm Jungling informed that patient's HR is 105-120 on cardizem drip at 10/mr. BP is 94/79 so drip cannot be titrated up at this time.  MD ok with rate at 10 for now.  Recheck in 39

## 2016-02-11 NOTE — Progress Notes (Signed)
Name: James Newman   MRN: [id number]    DOB: 02-06-1951   Date:02/11/2016       Progress Note  Subjective  Chief Complaint  Chief Complaint  Patient presents with  . Fatigue    patient stated that since last wednesday he has been having intermittent episode of fatigue.    HPI  Atrial Fibrillation rapid ventricular response: he has paroxysmal afib for many years and is on coumadin. Last Wednesday he developed some SOB with activity. It was intermittent, however past couple of days he has a continues feeling of SOB, no chest pain, he has noticed fatigued, mild diaphoresis last night and orthopnea. He has lack of appetite. He denies cold symptoms, but had a mild cough last night.   Patient Active Problem List   Diagnosis Date Noted  . 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 Encounter for therapeutic drug level monitoring 03/14/2015  . 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 B12 deficiency 03/14/2015  . Dyslipidemia 03/14/2015  . 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 Obesity (BMI 30-39.9) 03/14/2015  . Vitamin D deficiency 03/14/2015  . A-fib ([place]) 03/08/2007    Past Surgical History  Procedure Laterality Date  . Nasal septum surgery      Family History  Problem Relation Age of Onset  . COPD Mother   . Hypertension Son   . Obesity Son     Social History   Social History  . Marital Status: Married    Spouse Name: N/A  . Number of Children: N/A  . Years of Education: N/A   Occupational History  . Not on file.   Social History Main Topics  . Smoking status: Never Smoker   . Smokeless tobacco: Never Used  . Alcohol Use: 1.2 oz/week    2 Standard drinks or equivalent per week  . Drug Use: No  . Sexual Activity:    Partners: Female   Other Topics Concern  . Not on file   Social History Narrative     Current outpatient prescriptions:  .  cholecalciferol  (VITAMIN D) 1000 UNITS tablet, Take 2,000 Units by mouth daily., Disp: , Rfl:  .  Cyanocobalamin (B-12) 1000 MCG SUBL, Place 1 tablet under the tongue daily., Disp: , Rfl:  .  Testosterone 12.5 MG/ACT (1%) GEL, Place 50 mg onto the skin daily., Disp: 150 g, Rfl: 0 .  warfarin (COUMADIN) 5 MG tablet, Take 1 tablet (5 mg total) by mouth daily. Take 1 tablet by mouth daily and 1& 1/2 tablets on Tuesday and Thursday, Disp: 45 tablet, Rfl: 5  No Known Allergies   ROS  Ten systems reviewed and is negative except as mentioned in HPI   Objective  Filed Vitals:   02/11/16 0856  Pulse: 187  Temp: 97.8 F (36.6 C)  TempSrc: Oral  Resp: 22  Weight: 260 lb 12.8 oz (118.298 kg)  SpO2: 93%    Body mass index is 35.36 kg/(m^2).  Physical Exam  Constitutional: Patient appears well-developed and well-nourished. Obese No distress.  HEENT: head atraumatic, normocephalic, pupils equal and reactive to light,  neck supple, throat within normal limits Cardiovascular: Irregular rate and rhythm.  No murmur heard. 1 plus ankle  BLE edema. Pulmonary/Chest: Effort normal, fine crackles on lower base. No respiratory distress. Abdominal: Soft.  There is no tenderness. Psychiatric: Patient has a normal mood and affect. behavior is normal. Judgment and thought content normal.  Recent Results (from the past 2160 hour(s))  Lipid panel     Status: Abnormal   Collection Time: 11/22/15  8:58 AM  Result Value Ref Range   Cholesterol, Total 187 100 - 199 mg/dL   Triglycerides 90 0 - 149 mg/dL   HDL 40 >39 mg/dL   VLDL Cholesterol Cal 18 5 - 40 mg/dL   LDL Calculated 129 (H) 0 - 99 mg/dL   Chol/HDL Ratio 4.7 0.0 - 5.0 ratio units    Comment:                                   T. Chol/HDL Ratio                                             Men  Women                               1/2 Avg.Risk  3.4    3.3                                   Avg.Risk  5.0    4.4                                2X Avg.Risk  9.6     7.1                                3X Avg.Risk 23.4   11.0   CBC with Differential/Platelet     Status: Abnormal   Collection Time: 11/22/15  8:58 AM  Result Value Ref Range   WBC 10.9 (H) 3.4 - 10.8 x10E3/uL   RBC 5.99 (H) 4.14 - 5.80 x10E6/uL   Hemoglobin 18.1 (H) 12.6 - 17.7 g/dL   Hematocrit 53.5 (H) 37.5 - 51.0 %   MCV 89 79 - 97 fL   MCH 30.2 26.6 - 33.0 pg   MCHC 33.8 31.5 - 35.7 g/dL   RDW 14.4 12.3 - 15.4 %   Platelets 184 150 - 379 x10E3/uL   Neutrophils 71 %   Lymphs 19 %   Monocytes 8 %   Eos 2 %   Basos 0 %   Neutrophils Absolute 7.7 (H) 1.4 - 7.0 x10E3/uL   Lymphocytes Absolute 2.1 0.7 - 3.1 x10E3/uL   Monocytes Absolute 0.8 0.1 - 0.9 x10E3/uL   EOS (ABSOLUTE) 0.2 0.0 - 0.4 x10E3/uL   Basophils Absolute 0.0 0.0 - 0.2 x10E3/uL   Immature Granulocytes 0 %   Immature Grans (Abs) 0.0 0.0 - 0.1 x10E3/uL  Testosterone     Status: None   Collection Time: 11/22/15  8:58 AM  Result Value Ref Range   Testosterone 1117 348 - 1197 ng/dL   Comment, Testosterone Comment     Comment: Adult male reference interval is based on a population of lean males up to 63 years old.   Comprehensive metabolic panel     Status: Abnormal   Collection Time: 11/22/15  8:58 AM  Result Value Ref Range   Glucose 95 65 - 99  mg/dL   BUN 12 8 - 27 mg/dL   Creatinine, Ser 1.01 0.76 - 1.27 mg/dL   GFR calc non Af Amer 79 >59 mL/min/1.73   GFR calc Af Amer 92 >59 mL/min/1.73   BUN/Creatinine Ratio 12 10 - 22   Sodium 142 134 - 144 mmol/L   Potassium 5.1 3.5 - 5.2 mmol/L   Chloride 102 96 - 106 mmol/L   CO2 24 18 - 29 mmol/L   Calcium 9.5 8.6 - 10.2 mg/dL   Total Protein 7.0 6.0 - 8.5 g/dL   Albumin 4.0 3.6 - 4.8 g/dL   Globulin, Total 3.0 1.5 - 4.5 g/dL   Albumin/Globulin Ratio 1.3 1.1 - 2.5    Comment: **Effective December 10, 2015 the reference interval**   for A/G Ratio will be changing to:              Age                Male          Male           0 -  7 days       1.1 - 2.3        1.1 - 2.3           8 - 30 days       1.2 - 2.8       1.2 - 2.8           1 -  6 months     1.3 - 3.6       1.3 - 3.6    7 months -  5 years      1.5 - 2.6       1.5 - 2.6              > 5 years      1.2 - 2.2       1.2 - 2.2    Bilirubin Total 1.3 (H) 0.0 - 1.2 mg/dL   Alkaline Phosphatase 46 39 - 117 IU/L   AST 19 0 - 40 IU/L   ALT 14 0 - 44 IU/L     PHQ2/9: Depression screen [facility] 2/9 02/11/2016 11/22/2015 08/29/2015 03/15/2015  Decreased Interest 0 0 0 0  Down, Depressed, Hopeless 0 0 0 0  PHQ - 2 Score 0 0 0 0   Fall Risk: Fall Risk  02/11/2016 11/22/2015 08/29/2015 03/15/2015  Falls in the past year? No No No No    Functional Status Survey: Is the patient deaf or have difficulty hearing?: No Does the patient have difficulty seeing, even when wearing glasses/contacts?: No Does the patient have difficulty concentrating, remembering, or making decisions?: No Does the patient have difficulty walking or climbing stairs?: No Does the patient have difficulty dressing or bathing?: No Does the patient have difficulty doing errands alone such as visiting a doctor's office or shopping?: No    Assessment & Plan  1. Atrial fibrillation with rapid ventricular response [facility])  Called EMS, we are transporting him to [facility] by EMS. Wife notified.

## 2016-02-11 NOTE — H&P (Signed)
[facility] [place] NAME: James Newman    MR#:  [id number]  DATE OF BIRTH:  [birth year]  DATE OF ADMISSION:  02/11/2016  PRIMARY CARE PHYSICIAN: Loistine Chance, MD   REQUESTING/REFERRING PHYSICIAN: Lavonia Drafts, MD  CHIEF COMPLAINT:   Chief Complaint  Patient presents with  . Tachycardia    HISTORY OF PRESENT ILLNESS:  James Newman  is a 65 y.o. male with a known history of Atrial fibrillation on Coumadin is being admitted for atrial fibrillation with rapid ventricular response.  He has been reporting shortness of breath for last 2 days. He does report it slightly worse when lying flat. He went to see his physician today who noted atrial fibrillation with rapid ventricular response and was requested to come to the emergency department. He denies chest pain. No recent travel. No calf pain or swelling.Received 20 of Cardizem by EMS.  His heart rate is still in 120s to 130s with a blood pressure in 90s. PAST MEDICAL HISTORY:   Past Medical History  Diagnosis Date  . Tension headache   . Encounter for long-term (current) use of medications   . Anticoagulated on Coumadin   . Hyperlipidemia   . Hypotestosteronemia   . Lacking in energy   . Obesity, Class II, BMI 35-39.9, no comorbidity ([place])   . Impotence of organic origin   . Vitamin D deficiency   . Atrial fibrillation with normal ventricular rate ([place])    PAST SURGICAL HISTORY:   Past Surgical History  Procedure Laterality Date  . Nasal septum surgery     SOCIAL HISTORY:   Social History  Substance Use Topics  . Smoking status: Never Smoker   . Smokeless tobacco: Never Used  . Alcohol Use: 1.2 oz/week    2 Standard drinks or equivalent per week    FAMILY HISTORY:   Family History  Problem Relation Age of Onset  . COPD Mother   . Hypertension Son   . Obesity Son     DRUG ALLERGIES:  No Known Allergies  REVIEW OF SYSTEMS:   Review of Systems   Constitutional: Negative for fever, weight loss, malaise/fatigue and diaphoresis.  HENT: Negative for ear discharge, ear pain, hearing loss, nosebleeds, sore throat and tinnitus.   Eyes: Negative for blurred vision and pain.  Respiratory: Positive for shortness of breath. Negative for cough, hemoptysis and wheezing.   Cardiovascular: Negative for chest pain, palpitations, orthopnea and leg swelling.  Gastrointestinal: Negative for heartburn, nausea, vomiting, abdominal pain, diarrhea, constipation and blood in stool.  Genitourinary: Negative for dysuria, urgency and frequency.  Musculoskeletal: Negative for myalgias and back pain.  Skin: Negative for itching and rash.  Neurological: Negative for dizziness, tingling, tremors, focal weakness, seizures, weakness and headaches.  Psychiatric/Behavioral: Negative for depression. The patient is not nervous/anxious.     MEDICATIONS AT HOME:   Prior to Admission medications   Medication Sig Start Date End Date Taking? Authorizing Provider  warfarin (COUMADIN) 5 MG tablet Take 1 tablet (5 mg total) by mouth daily. Take 1 tablet by mouth daily and 1& 1/2 tablets on Tuesday and Thursday 05/16/15  Yes Steele Sizer, MD  Testosterone 12.5 MG/ACT (1%) GEL Place 50 mg onto the skin daily. 02/05/16   Steele Sizer, MD      VITAL SIGNS:  Blood pressure 100/80, pulse 110, temperature 97.5 F (36.4 C), temperature source Oral, resp. rate 18, height 6' (1.829 m), weight 117.935 kg (260 lb), SpO2 94 %.  PHYSICAL EXAMINATION:  Physical Exam  Constitutional: He is oriented to person, place, and time and well-developed, well-nourished, and in no distress.  HENT:  Head: Normocephalic and atraumatic.  Eyes: Conjunctivae and EOM are normal. Pupils are equal, round, and reactive to light.  Neck: Normal range of motion. Neck supple. No tracheal deviation present. No thyromegaly present.  Cardiovascular: Normal heart sounds.  An irregularly irregular rhythm  present. Tachycardia present.   Pulmonary/Chest: Effort normal and breath sounds normal. No respiratory distress. He has no wheezes. He exhibits no tenderness.  Abdominal: Soft. Bowel sounds are normal. He exhibits no distension. There is no tenderness.  Musculoskeletal: Normal range of motion.  Neurological: He is alert and oriented to person, place, and time. No cranial nerve deficit.  Skin: Skin is warm and dry. No rash noted.  Psychiatric: Mood and affect normal.   LABORATORY PANEL:   CBC  Recent Labs Lab 02/11/16 0954  WBC 9.7  HGB 15.9  HCT 47.5  PLT 155   ------------------------------------------------------------------------------------------------------------------  Chemistries   Recent Labs Lab 02/11/16 1033  NA 136  K 3.9  CL 106  CO2 23  GLUCOSE 108*  BUN 12  CREATININE 0.97  CALCIUM 8.9  AST 27  ALT 23  ALKPHOS 42  BILITOT 3.2*   ------------------------------------------------------------------------------------------------------------------  Cardiac Enzymes  Recent Labs Lab 02/11/16 0953  TROPONINI 0.04*   ------------------------------------------------------------------------------------------------------------------  RADIOLOGY:  Dg Chest Portable 1 View  02/11/2016  CLINICAL DATA:  Shortness breath for 2 days.  Tachycardia. EXAM: PORTABLE CHEST 1 VIEW COMPARISON:  None. FINDINGS: Portable AP view of the chest was obtained. The cardiac silhouette is mildly enlarged but may be related to the AP projection. Interstitial lung markings are mildly prominent. There is no focal airspace disease. Trachea is midline. Bone structures appear to be intact. Negative for a pneumothorax. IMPRESSION: Heart size is mildly prominent and there are prominent interstitial lung markings. Cannot exclude mild interstitial pulmonary edema. Electronically Signed   By: Markus Daft M.D.   On: 02/11/2016 10:17   IMPRESSION AND PLAN:  65 year old male with chronic atrial  fibrillation  * Atrial fibrillation with rapid ventricular response - Given Cardizem CD 240 mg in the emergency department - Heart rate still in 120s to 130s, so we will go and start Cardizem drip - Cardiology consult and Dr. Nehemiah Massed already seeing him in the emergency department - Already on Coumadin.  Will consult pharmacy for Coumadin dose management  * Borderline elevated troponin - Likely due to rapid A. Fib - We will trend 2 more sets of troponin - Monitor him on telemetry  * Pulmonary edema - Seen on Chest x-ray - We will get 2-D echo for further evaluation. could have diastolic heart failure  * Hyperlipidemia - Not on statin.  Will check lipid profile in the morning    All the records are reviewed and case discussed with ED provider. Management plans discussed with the patient, family and they are in agreement.  CODE STATUS: Full code  TOTAL TIME TAKING CARE OF THIS PATIENT: 45 minutes.    [facility], Inice Sanluis M.D on 02/11/2016 at 1:10 PM  Between 7am to 6pm - Pager - [phone]  After 6pm go to [email or web address] - password [email or web address] [facility]  Office  [phone]  CC: Primary care physician; Loistine Chance, MD   Note: This dictation was prepared with Dragon dictation along with smaller phrase technology. Any transcriptional errors that result from this process are unintentional.

## 2016-02-11 NOTE — Progress Notes (Signed)
ANTICOAGULATION CONSULT NOTE - Initial Consult  Pharmacy Consult for Warfarin Indication: atrial fibrillation  No Known Allergies  Patient Measurements: Height: 6' (182.9 cm) Weight: 260 lb (117.935 kg) IBW/kg (Calculated) : 77.6  Vital Signs: Temp: 96.2 F (35.7 C) (05/15 1427) Temp Source: Axillary (05/15 1427) BP: 125/75 mmHg (05/15 1427) Pulse Rate: 40 (05/15 1427)  Labs:  Recent Labs  02/11/16 0953 02/11/16 0954 02/11/16 1033  HGB  --  15.9  --   HCT  --  47.5  --   PLT  --  155  --   LABPROT  --  34.1*  --   INR  --  3.46  --   CREATININE  --   --  0.97  TROPONINI 0.04*  --   --     Estimated Creatinine Clearance: 104.6 mL/min (by C-G formula based on Cr of 0.97).   Assessment: Patient with history of afib, admitted for afib with RVR. On coumadin 5mg  daily. Has already taken dose of coumadin this morning. INR: 3.46  Goal of Therapy:  INR 2-3 Monitor platelets by anticoagulation protocol: Yes   Plan:  No coumadin ordered for today (patient has already taken dose). Will reduce dose to 4mg  daily to start tomorrow evening if INR is appropriate. Recheck INR with AM labs  James Newman C 02/11/2016,3:04 PM

## 2016-02-11 NOTE — ED Provider Notes (Signed)
[facility] Emergency Department Provider Note  ____________________________________________    I have reviewed the triage vital signs and the nursing notes.   HISTORY  Chief Complaint Tachycardia    HPI James Newman is a 65 y.o. male who presents with complaints of shortness of breath. Patient reports this started approximately 2 days ago. He does report a slightly worse when lying flat. He went to see his physician today who noted atrial fibrillation with rapid ventricular response. Patient has a history of atrial fibrillation and is on Coumadin. He denies chest pain. No recent travel. No calf pain or swelling.Received 20 of Cardizem by EMS     Past Medical History  Diagnosis Date  . Tension headache   . Encounter for long-term (current) use of medications   . Anticoagulated on Coumadin   . Hyperlipidemia   . Hypotestosteronemia   . Lacking in energy   . Obesity, Class II, BMI 35-39.9, no comorbidity ([place])   . Impotence of organic origin   . Vitamin D deficiency   . Atrial fibrillation with normal ventricular rate [facility])     Patient Active Problem List   Diagnosis Date Noted  . 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 Encounter for therapeutic drug level monitoring 03/14/2015  . 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 B12 deficiency 03/14/2015  . Dyslipidemia 03/14/2015  . 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 Obesity (BMI 30-39.9) 03/14/2015  . Vitamin D deficiency 03/14/2015  . A-fib ([place]) 03/08/2007    Past Surgical History  Procedure Laterality Date  . Nasal septum surgery      Current Outpatient Rx  Name  Route  Sig  Dispense  Refill  . warfarin (COUMADIN) 5 MG tablet   Oral   Take 1 tablet (5 mg total) by mouth daily. Take 1 tablet by mouth daily and 1& 1/2 tablets on Tuesday and Thursday   45 tablet   5   . Testosterone  12.5 MG/ACT (1%) GEL   Transdermal   Place 50 mg onto the skin daily.   150 g   0     Allergies Review of patient's allergies indicates no known allergies.  Family History  Problem Relation Age of Onset  . COPD Mother   . Hypertension Son   . Obesity Son     Social History Social History  Substance Use Topics  . Smoking status: Never Smoker   . Smokeless tobacco: Never Used  . Alcohol Use: 1.2 oz/week    2 Standard drinks or equivalent per week    Review of Systems  Constitutional: Negative for fever. Eyes: Negative for redness ENT: Negative for sore throat Cardiovascular: Negative for chest pain, positive for palpitations Respiratory: Mild shortness of breath Gastrointestinal: Negative for abdominal pain Genitourinary: Negative for dysuria. Musculoskeletal: Negative for back pain. Skin: Negative for rash. Neurological: Negative for focal weakness Psychiatric: no anxiety    ____________________________________________   PHYSICAL EXAM:  VITAL SIGNS: ED Triage Vitals  Enc Vitals Group     BP 02/11/16 0945 123/93 mmHg     Pulse Rate 02/11/16 0950 134     Resp 02/11/16 0945 19     Temp 02/11/16 0950 97.5 F (36.4 C)     Temp Source 02/11/16 0950 Oral     SpO2 02/11/16 0950 94 %     Weight 02/11/16 0950 260 lb (117.935 kg)     Height 02/11/16  [id number] 6' (1.829 m)     Head Cir --      Peak Flow --      Pain Score --      Pain Loc --      Pain Edu? --      Excl. in [place]? --      Constitutional: Alert and oriented. Well appearing and in no distress. Doesn't and interactive Eyes: Conjunctivae are normal. No erythema or injection ENT   Head: Normocephalic and atraumatic.   Mouth/Throat: Mucous membranes are moist. Cardiovascular: Tachycardia, irregularly irregular rhythm. Normal and symmetric distal pulses are present in the upper extremities.  Respiratory: Normal respiratory effort without tachypnea nor retractions. Scattered Rales  bibasilarly Gastrointestinal: Soft and non-tender in all quadrants. No distention. There is no CVA tenderness. Genitourinary: deferred Musculoskeletal: Nontender with normal range of motion in all extremities. No lower extremity tenderness nor edema. Neurologic:  Normal speech and language. No gross focal neurologic deficits are appreciated. Skin:  Skin is warm, dry and intact. No rash noted. Psychiatric: Mood and affect are normal. Patient exhibits appropriate insight and judgment.  ____________________________________________    LABS (pertinent positives/negatives)  Labs Reviewed  CBC - Abnormal; Notable for the following:    RDW 16.1 (*)    All other components within normal limits  TROPONIN I - Abnormal; Notable for the following:    Troponin I 0.04 (*)    All other components within normal limits  BRAIN NATRIURETIC PEPTIDE - Abnormal; Notable for the following:    B Natriuretic Peptide 320.0 (*)    All other components within normal limits  COMPREHENSIVE METABOLIC PANEL - Abnormal; Notable for the following:    Glucose, Bld 108 (*)    Total Bilirubin 3.2 (*)    All other components within normal limits    ____________________________________________   EKG  ED ECG REPORT I, Lavonia Drafts, the attending physician, personally viewed and interpreted this ECG.   Date: 02/11/2016  EKG Time: 0944  Rate: 133  Rhythm: atrial fibrillation, rate 133  Axis: normal  Intervals:none  ST&T Change: non specific   ____________________________________________    RADIOLOGY  Interstitial edema on chest x-ray  ____________________________________________   PROCEDURES  Procedure(s) performed: none  Critical Care performed: yes  CRITICAL CARE Performed by: Lavonia Drafts   Total critical care time:30 minutes  Critical care time was exclusive of separately billable procedures and treating other patients.  Critical care was necessary to treat or prevent imminent or  life-threatening deterioration.  Critical care was time spent personally by me on the following activities: development of treatment plan with patient and/or surrogate as well as nursing, discussions with consultants, evaluation of patient's response to treatment, examination of patient, obtaining history from patient or surrogate, ordering and performing treatments and interventions, ordering and review of laboratory studies, ordering and review of radiographic studies, pulse oximetry and re-evaluation of patient's condition.   ____________________________________________   INITIAL IMPRESSION / ASSESSMENT AND PLAN / ED COURSE  Pertinent labs & imaging results that were available during my care of the patient were reviewed by me and considered in my medical decision making (see chart for details).  Patient presents with atrial fibrillation with rapid ventricular response. Received 20 of Cardizem by EMS but heart rate back in the 130s, we will give an additional 20 mg of Cardizem IV, check labs,  and chest x-ray and reevaluate  Chest x-ray consistent with interstitial edema, troponin is mildly elevated. Discussed with Dr. Nehemiah Massed of cardiology, recommends Cardizem 240  mg CD given heart rate is mostly controlled at this point.    ____________________________________________   FINAL CLINICAL IMPRESSION(S) / ED DIAGNOSES  Final diagnoses:  Atrial fibrillation with rapid ventricular response ([place])  Pulmonary edema cardiac cause ([facility])          Lavonia Drafts, MD 02/11/16 1217

## 2016-02-11 NOTE — ED Notes (Signed)
Pt from doctor office with reports that pt went to his PCP for sob with exertion since Saturday. In office pts HR was 160's. EMS arrived and administered 20mg  Cardizem IV. HR decreased to 120's. Pt denies chest pain, reports yesterday he felt like his heart was racing. Pt a/o in NAD.

## 2016-02-12 LAB — CBC
HEMATOCRIT: 47.4 % (ref 40.0–52.0)
HEMOGLOBIN: 15.6 g/dL (ref 13.0–18.0)
MCH: 29.5 pg (ref 26.0–34.0)
MCHC: 32.8 g/dL (ref 32.0–36.0)
MCV: 89.9 fL (ref 80.0–100.0)
Platelets: 154 10*3/uL (ref 150–440)
RBC: 5.28 MIL/uL (ref 4.40–5.90)
RDW: 16.4 % — ABNORMAL HIGH (ref 11.5–14.5)
WBC: 11.4 10*3/uL — ABNORMAL HIGH (ref 3.8–10.6)

## 2016-02-12 LAB — PROTIME-INR
INR: 3.65
PROTHROMBIN TIME: 35.5 s — AB (ref 11.4–15.0)

## 2016-02-12 LAB — LIPID PANEL
CHOL/HDL RATIO: 4.6 ratio
Cholesterol: 139 mg/dL (ref 0–200)
HDL: 30 mg/dL — ABNORMAL LOW (ref >40)
LDL Cholesterol: 97 mg/dL (ref 0–99)
Triglycerides: 62 mg/dL (ref <150)
VLDL: 12 mg/dL (ref 0–40)

## 2016-02-12 LAB — BASIC METABOLIC PANEL
ANION GAP: 7 (ref 5–15)
BUN: 16 mg/dL (ref 6–20)
CHLORIDE: 106 mmol/L (ref 101–111)
CO2: 25 mmol/L (ref 22–32)
Calcium: 9 mg/dL (ref 8.9–10.3)
Creatinine, Ser: 1.04 mg/dL (ref 0.61–1.24)
GFR calc non Af Amer: >60 mL/min (ref >60)
Glucose, Bld: 116 mg/dL — ABNORMAL HIGH (ref 65–99)
Potassium: 4.5 mmol/L (ref 3.5–5.1)
Sodium: 138 mmol/L (ref 135–145)

## 2016-02-12 LAB — ECHOCARDIOGRAM COMPLETE
HEIGHTINCHES: 72 in
Weight: 4160 oz

## 2016-02-12 MED ORDER — POTASSIUM CHLORIDE 20 MEQ/15ML (10%) PO SOLN
40.0000 meq | Freq: Once | ORAL | Status: AC
  Administered 2016-02-12: 40 meq via ORAL
  Filled 2016-02-12: qty 30

## 2016-02-12 MED ORDER — FUROSEMIDE 10 MG/ML IJ SOLN
40.0000 mg | Freq: Every day | INTRAMUSCULAR | Status: DC
  Administered 2016-02-13: 40 mg via INTRAVENOUS
  Filled 2016-02-12: qty 4

## 2016-02-12 MED ORDER — DILTIAZEM HCL ER COATED BEADS 240 MG PO CP24
240.0000 mg | ORAL_CAPSULE | Freq: Every day | ORAL | Status: DC
  Administered 2016-02-12 – 2016-02-13 (×2): 240 mg via ORAL
  Filled 2016-02-12 (×2): qty 1

## 2016-02-12 MED ORDER — FUROSEMIDE 10 MG/ML IJ SOLN
40.0000 mg | Freq: Once | INTRAMUSCULAR | Status: AC
  Administered 2016-02-12: 40 mg via INTRAVENOUS
  Filled 2016-02-12: qty 4

## 2016-02-12 NOTE — Discharge Instructions (Signed)

## 2016-02-12 NOTE — Progress Notes (Signed)
[facility] Cardiology [place] Encounter Note  Patient: James Newman / Admit Date: 02/11/2016 / Date of Encounter: 02/12/2016, 8:47 AM   Subjective: Patient is feeling much better at this time so no further chest discomfort but does have some shortness of breath and continued lower extremity edema. Heart rate better controlled with medical regimen  Review of Systems: Positive for: Shortness of breath Negative for: Vision change, hearing change, syncope, dizziness, nausea, vomiting,diarrhea, bloody stool, stomach pain, cough, congestion, diaphoresis, urinary frequency, urinary pain,skin lesions, skin rashes Others previously listed  Objective: Telemetry: Atrial fibrillation with more controlled rate Physical Exam: Blood pressure 127/94, pulse 98, temperature 97.8 F (36.6 C), temperature source Oral, resp. rate 18, height 6' (1.829 m), weight 255 lb 14.4 oz (116.075 kg), SpO2 96 %. Body mass index is 34.7 kg/(m^2). General: Well developed, well nourished, in no acute distress. Head: Normocephalic, atraumatic, sclera non-icteric, no xanthomas, nares are without discharge. Neck: No apparent masses Lungs: Normal respirations with no wheezes, no rhonchi, no rales , few crackles   Heart: Irregular rate and rhythm, normal S1 S2, no murmur, no rub, no gallop, PMI is normal size and placement, carotid upstroke normal without bruit, jugular venous pressure normal Abdomen: Soft, non-tender, non-distended with normoactive bowel sounds. No hepatosplenomegaly. Abdominal aorta is normal size without bruit Extremities: 1+ edema, no clubbing, no cyanosis, no ulcers,  Peripheral: 2+ radial, 2+ femoral, 2+ dorsal pedal pulses Neuro: Alert and oriented. Moves all extremities spontaneously. Psych:  Responds to questions appropriately with a normal affect.   Intake/Output Summary (Last 24 hours) at 02/12/16 0847 Last data filed at 02/12/16 0804  Gross per 24 hour  Intake    240 ml  Output    700 ml   Net   -460 ml    Inpatient Medications:  . diltiazem  10 mg Intravenous Once  . sodium chloride flush  3 mL Intravenous Q12H  . Warfarin - Pharmacist Dosing Inpatient   Does not apply q1800   Infusions:  . diltiazem (CARDIZEM) infusion 10 mg/hr (02/11/16 2159)    Labs:  Recent Labs  02/11/16 1033 02/12/16 0422  NA 136 138  K 3.9 4.5  CL 106 106  CO2 23 25  GLUCOSE 108* 116*  BUN 12 16  CREATININE 0.97 1.04  CALCIUM 8.9 9.0    Recent Labs  02/11/16 1033  AST 27  ALT 23  ALKPHOS 42  BILITOT 3.2*  PROT 7.0  ALBUMIN 3.7    Recent Labs  02/11/16 0954 02/12/16 0422  WBC 9.7 11.4*  HGB 15.9 15.6  HCT 47.5 47.4  MCV 89.0 89.9  PLT 155 154    Recent Labs  02/11/16 0953 02/11/16 1523 02/11/16 1858 02/11/16 2206  TROPONINI 0.04* 0.03 0.04* 0.04*   Invalid input(s): POCBNP No results for input(s): HGBA1C in the last 72 hours.   Weights: Filed Weights   02/11/16 0950 02/11/16 1400 02/12/16 0518  Weight: 260 lb (117.935 kg) 260 lb (117.935 kg) 255 lb 14.4 oz (116.075 kg)     Radiology/Studies:  Dg Chest Portable 1 View  02/11/2016  CLINICAL DATA:  Shortness breath for 2 days.  Tachycardia. EXAM: PORTABLE CHEST 1 VIEW COMPARISON:  None. FINDINGS: Portable AP view of the chest was obtained. The cardiac silhouette is mildly enlarged but may be related to the AP projection. Interstitial lung markings are mildly prominent. There is no focal airspace disease. Trachea is midline. Bone structures appear to be intact. Negative for a pneumothorax. IMPRESSION: Heart size is mildly  prominent and there are prominent interstitial lung markings. Cannot exclude mild interstitial pulmonary edema. Electronically Signed   By: Markus Daft M.D.   On: 02/11/2016 10:17     Assessment and Recommendation  65 y.o. male with chronic atrial fibrillation now with rapid ventricular rate and episode of acute congestive heart failure with edema and shortness of breath and no current  evidence of myocardial infarction X 1. Echocardiogram pending for assessment of type of congestive heart failure 2. Lasix for concerns of pulmonary edema and lower extremity edema 3. Continue warfarin for further risk reduction in stroke with atrial fibrillation with a goal INR between 2 and 3 4. Continue current dose of diltiazem and possible increased dose depending on ambulation today for better heart rate control with a goal heart rate at rest between 60 and 100 bpm  Signed, Serafina Royals M.D. FACC

## 2016-02-12 NOTE — Progress Notes (Signed)
ANTICOAGULATION CONSULT NOTE - follow up [place] for Warfarin Indication: atrial fibrillation  No Known Allergies  Patient Measurements: Height: 6' (182.9 cm) Weight: 255 lb 14.4 oz (116.075 kg) IBW/kg (Calculated) : 77.6  Vital Signs: Temp: 97.8 F (36.6 C) (05/16 0518) Temp Source: Oral (05/16 0518) BP: 126/86 mmHg (05/16 0518) Pulse Rate: 110 (05/16 0518)  Labs:  Recent Labs  02/11/16 0954 02/11/16 1033 02/11/16 1523 02/11/16 1858 02/11/16 2206 02/12/16 0422  HGB 15.9  --   --   --   --  15.6  HCT 47.5  --   --   --   --  47.4  PLT 155  --   --   --   --  154  LABPROT 34.1*  --   --   --   --  35.5*  INR 3.46  --   --   --   --  3.65  CREATININE  --  0.97  --   --   --  1.04  TROPONINI  --   --  0.03 0.04* 0.04*  --     Estimated Creatinine Clearance: 96.9 mL/min (by C-G formula based on Cr of 1.04).   Assessment: Patient with history of afib, admitted for afib with RVR. On coumadin 5mg  daily. Has already taken dose of coumadin this morning. INR: 3.46  5/15  INR 3.46  Patient had taken home dose of 5mg  5/16  INR 3.65  Goal of Therapy:  INR 2-3 Monitor platelets by anticoagulation protocol: Yes   Plan:  No coumadin ordered for today (patient has already taken dose). Will reduce dose to 4mg  daily to start tomorrow evening if INR is appropriate. Recheck INR with AM labs  5/16:  Will hold Warfarin today. Recheck INR with am labs.  Ad Guttman A 02/12/2016,7:50 AM

## 2016-02-12 NOTE — Progress Notes (Signed)
Pt educated on cardizem drip and safety precautions - does not want bed alarm on but agrees to call for assistance before getting up. Does not want second IV at this time since he has 'been stuck with too many needles recently' and is not getting any other IV meds.

## 2016-02-12 NOTE — Progress Notes (Signed)
Dr. Benjie Karvonen rounding now. Patient to be started on 240 mg PO cardizem this morning then wean off drip.

## 2016-02-12 NOTE — Progress Notes (Signed)
[place] [facility] [place] NAME: James Newman    MR#:  [id number]  DATE OF BIRTH:  1951-06-18  SUBJECTIVE:   Patient feeling better still with some shortness of breath and lower extremity edema  REVIEW OF SYSTEMS:    Review of Systems  Constitutional: Negative for fever, chills and malaise/fatigue.  HENT: Negative for ear discharge, ear pain, hearing loss, nosebleeds and sore throat.   Eyes: Negative for blurred vision and pain.  Respiratory: Positive for shortness of breath. Negative for cough, hemoptysis and wheezing.   Cardiovascular: Positive for leg swelling. Negative for chest pain and palpitations.  Gastrointestinal: Negative for nausea, vomiting, abdominal pain, diarrhea and blood in stool.  Genitourinary: Negative for dysuria.  Musculoskeletal: Negative for back pain.  Neurological: Negative for dizziness, tremors, speech change, focal weakness, seizures and headaches.  Endo/Heme/Allergies: Does not bruise/bleed easily.  Psychiatric/Behavioral: Negative for depression, suicidal ideas and hallucinations.    Tolerating Diet:yes      DRUG ALLERGIES:  No Known Allergies  VITALS:  Blood pressure 112/77, pulse 85, temperature 97.8 F (36.6 C), temperature source Oral, resp. rate 18, height 6' (1.829 m), weight 116.075 kg (255 lb 14.4 oz), SpO2 96 %.  PHYSICAL EXAMINATION:   Physical Exam  Constitutional: He is oriented to person, place, and time and well-developed, well-nourished, and in no distress. No distress.  HENT:  Head: Normocephalic.  Eyes: No scleral icterus.  Neck: Normal range of motion. Neck supple. No JVD present. No tracheal deviation present.  Cardiovascular: Normal rate, regular rhythm and normal heart sounds.  Exam reveals no gallop and no friction rub.   No murmur heard. Irr, irr  Pulmonary/Chest: Effort normal. No respiratory distress. He has no wheezes. He has rales. He exhibits no tenderness.  Abdominal:  Soft. Bowel sounds are normal. He exhibits no distension and no mass. There is no tenderness. There is no rebound and no guarding.  Musculoskeletal: Normal range of motion. He exhibits edema.  Neurological: He is alert and oriented to person, place, and time.  Skin: Skin is warm. No rash noted. No erythema.  Psychiatric: Affect and judgment normal.      LABORATORY PANEL:   CBC  Recent Labs Lab 02/12/16 0422  WBC 11.4*  HGB 15.6  HCT 47.4  PLT 154   ------------------------------------------------------------------------------------------------------------------  Chemistries   Recent Labs Lab 02/11/16 1033 02/12/16 0422  NA 136 138  K 3.9 4.5  CL 106 106  CO2 23 25  GLUCOSE 108* 116*  BUN 12 16  CREATININE 0.97 1.04  CALCIUM 8.9 9.0  AST 27  --   ALT 23  --   ALKPHOS 42  --   BILITOT 3.2*  --    ------------------------------------------------------------------------------------------------------------------  Cardiac Enzymes  Recent Labs Lab 02/11/16 1523 02/11/16 1858 02/11/16 2206  TROPONINI 0.03 0.04* 0.04*   ------------------------------------------------------------------------------------------------------------------  RADIOLOGY:  Dg Chest Portable 1 View  02/11/2016  CLINICAL DATA:  Shortness breath for 2 days.  Tachycardia. EXAM: PORTABLE CHEST 1 VIEW COMPARISON:  None. FINDINGS: Portable AP view of the chest was obtained. The cardiac silhouette is mildly enlarged but may be related to the AP projection. Interstitial lung markings are mildly prominent. There is no focal airspace disease. Trachea is midline. Bone structures appear to be intact. Negative for a pneumothorax. IMPRESSION: Heart size is mildly prominent and there are prominent interstitial lung markings. Cannot exclude mild interstitial pulmonary edema. Electronically Signed   By: Markus Daft M.D.   On: 02/11/2016  10:17     ASSESSMENT AND PLAN:   65 year old male with a history of  atrial fibrillation on Coumadin who presented in atrial fibrillation with RVR.  1. Atrial fibrillation with RVR: Heart rate is better controlled on diltiazem drip. Transition to oral diltiazem and wean diltiazem drip. INR is greater than 3 at this time and Coumadin is on hold for now.  Echocardiogram results pending. Appreciate cardiology consult.  2. Acute diastolic heart failure due to atrial fibrillation with RVR: Patient continues to have crackles on examination today. Start IV Lasix and monitor BMP. Follow-up an echocardiogram.   Management plans discussed with the patient and he is in agreement.  CODE STATUS: FULL  TOTAL TIME TAKING CARE OF THIS PATIENT: 33 minutes.     POSSIBLE D/C tomrrow, DEPENDING ON CLINICAL CONDITION.   Mena Lienau M.D on 02/12/2016 at 11:42 AM  Between 7am to 6pm - Pager - [phone] After 6pm go to [email or web address] - password [email or web address] [facility]  Office  [phone]  CC: Primary care physician; Loistine Chance, MD  Note: This dictation was prepared with Dragon dictation along with smaller phrase technology. Any transcriptional errors that result from this process are unintentional.

## 2016-02-13 LAB — BASIC METABOLIC PANEL
Anion gap: 5 (ref 5–15)
BUN: 17 mg/dL (ref 6–20)
CHLORIDE: 107 mmol/L (ref 101–111)
CO2: 27 mmol/L (ref 22–32)
CREATININE: 0.88 mg/dL (ref 0.61–1.24)
Calcium: 9 mg/dL (ref 8.9–10.3)
GFR calc non Af Amer: >60 mL/min (ref >60)
Glucose, Bld: 108 mg/dL — ABNORMAL HIGH (ref 65–99)
Potassium: 4.1 mmol/L (ref 3.5–5.1)
Sodium: 139 mmol/L (ref 135–145)

## 2016-02-13 LAB — PROTIME-INR
INR: 3.04
Prothrombin Time: 30.9 seconds — ABNORMAL HIGH (ref 11.4–15.0)

## 2016-02-13 MED ORDER — FUROSEMIDE 10 MG/ML IJ SOLN
40.0000 mg | Freq: Once | INTRAMUSCULAR | Status: AC
  Administered 2016-02-13: 40 mg via INTRAVENOUS
  Filled 2016-02-13: qty 4

## 2016-02-13 MED ORDER — LISINOPRIL 5 MG PO TABS: 5.0000 mg | ORAL_TABLET | Freq: Every day | ORAL | Status: DC

## 2016-02-13 MED ORDER — METOPROLOL SUCCINATE ER 50 MG PO TB24: 50.0000 mg | ORAL_TABLET | Freq: Every day | ORAL | Status: DC

## 2016-02-13 MED ORDER — FUROSEMIDE 40 MG PO TABS: 40.0000 mg | ORAL_TABLET | Freq: Every day | ORAL | Status: DC

## 2016-02-13 MED ORDER — AMIODARONE HCL 200 MG PO TABS: 200.0000 mg | ORAL_TABLET | Freq: Two times a day (BID) | ORAL | Status: DC

## 2016-02-13 MED ORDER — METOPROLOL SUCCINATE ER 100 MG PO TB24: 100.0000 mg | ORAL_TABLET | Freq: Every day | ORAL | Status: DC

## 2016-02-13 MED ORDER — LISINOPRIL 5 MG PO TABS
5.0000 mg | ORAL_TABLET | Freq: Every day | ORAL | Status: DC
  Administered 2016-02-13: 5 mg via ORAL
  Filled 2016-02-13: qty 1

## 2016-02-13 MED ORDER — DILTIAZEM HCL ER COATED BEADS 240 MG PO CP24
240.0000 mg | ORAL_CAPSULE | Freq: Every day | ORAL | Status: DC
Start: 2016-02-13 — End: 2016-03-12

## 2016-02-13 MED ORDER — WARFARIN SODIUM 5 MG PO TABS: 5.0000 mg | ORAL_TABLET | Freq: Every day | ORAL | Status: AC

## 2016-02-13 MED ORDER — METOPROLOL SUCCINATE ER 50 MG PO TB24
50.0000 mg | ORAL_TABLET | ORAL | Status: AC
  Administered 2016-02-13: 50 mg via ORAL
  Filled 2016-02-13: qty 1

## 2016-02-13 MED ORDER — DILTIAZEM HCL ER COATED BEADS 240 MG PO CP24: 240.0000 mg | ORAL_CAPSULE | Freq: Every day | ORAL | Status: DC

## 2016-02-13 NOTE — Progress Notes (Signed)
DR Nehemiah Massed and DR Benjie Karvonen ware informed about pt's HR in the 160 's while ambulating and 80's at rest . New order received  And pt's discharge on hold at this time and will be monitor closely

## 2016-02-13 NOTE — Plan of Care (Signed)
Problem: Food- and Nutrition-Related Knowledge Deficit (NB-1.1) Goal: Nutrition education Formal process to instruct or train a patient/client in a skill or to impart knowledge to help patients/clients voluntarily manage or modify food choices and eating behavior to maintain or improve health. Outcome: Completed/Met Date Met:  02/13/16   Nutrition Education Note  RD consulted for nutrition education regarding new onset CHF.  RD provided "Low Sodium Nutrition Therapy" handout from the Academy of [employer]. Reviewed patient's dietary recall. Provided examples on ways to decrease sodium intake in diet. Discouraged intake of processed foods and use of salt shaker. Encouraged fresh fruits and vegetables as well as whole grain sources of carbohydrates to maximize fiber intake.   RD also educated on lower cholesterol foods as well as pt reports eating take-out pizza often.  RD discussed why it is important for patient to adhere to diet recommendations, and emphasized the role of fluids, foods to avoid, and importance of weighing self daily. Teach back method used.  Expect good compliance.  Body mass index is 34.44 kg/(m^2). Pt meets criteria for obesity unspecified based on current BMI.  Current diet order is Heart healthy, patient is consuming approximately 100% of meals at this time. Labs and medications reviewed. No further nutrition interventions warranted at this time. RD contact information provided. If additional nutrition issues arise, please re-consult RD.   Dwyane Luo, RD, LDN Pager [phone] Weekend/On-Call Pager [phone]

## 2016-02-13 NOTE — Progress Notes (Signed)
[facility] Cardiology [facility] Encounter Note  Patient: James Newman / Admit Date: 02/11/2016 / Date of Encounter: 02/13/2016, 7:58 AM   Subjective: Patient is feeling much better at this time so no further chest discomfort but does have some shortness of breath and continued lower extremity edema. Heart rate better controlled with medical regimen. The patient has less heart failure after appropriate medication management and diuretic  Review of Systems: Positive for: Shortness of breath Negative for: Vision change, hearing change, syncope, dizziness, nausea, vomiting,diarrhea, bloody stool, stomach pain, cough, congestion, diaphoresis, urinary frequency, urinary pain,skin lesions, skin rashes Others previously listed  Objective: Telemetry: Atrial fibrillation with more controlled rate Physical Exam: Blood pressure 127/80, pulse 79, temperature 97.6 F (36.4 C), temperature source Oral, resp. rate 19, height 6' (1.829 m), weight 254 lb 0.2 oz (115.22 kg), SpO2 99 %. Body mass index is 34.44 kg/(m^2). General: Well developed, well nourished, in no acute distress. Head: Normocephalic, atraumatic, sclera non-icteric, no xanthomas, nares are without discharge. Neck: No apparent masses Lungs: Normal respirations with no wheezes, no rhonchi, no rales , few crackles   Heart: Irregular rate and rhythm, normal S1 S2, no murmur, no rub, no gallop, PMI is normal size and placement, carotid upstroke normal without bruit, jugular venous pressure normal Abdomen: Soft, non-tender, non-distended with normoactive bowel sounds. No hepatosplenomegaly. Abdominal aorta is normal size without bruit Extremities: 1+ edema, no clubbing, no cyanosis, no ulcers,  Peripheral: 2+ radial, 2+ femoral, 2+ dorsal pedal pulses Neuro: Alert and oriented. Moves all extremities spontaneously. Psych:  Responds to questions appropriately with a normal affect.   Intake/Output Summary (Last 24 hours) at 02/13/16 0758 Last  data filed at 02/13/16 0454  Gross per 24 hour  Intake    843 ml  Output   2050 ml  Net  -1207 ml    Inpatient Medications:  . diltiazem  240 mg Oral Daily  . diltiazem  10 mg Intravenous Once  . furosemide  40 mg Intravenous Daily  . lisinopril  5 mg Oral Daily  . sodium chloride flush  3 mL Intravenous Q12H  . Warfarin - Pharmacist Dosing Inpatient   Does not apply q1800   Infusions:     Labs:  Recent Labs  02/12/16 0422 02/13/16 0414  NA 138 139  K 4.5 4.1  CL 106 107  CO2 25 27  GLUCOSE 116* 108*  BUN 16 17  CREATININE 1.04 0.88  CALCIUM 9.0 9.0    Recent Labs  02/11/16 1033  AST 27  ALT 23  ALKPHOS 42  BILITOT 3.2*  PROT 7.0  ALBUMIN 3.7    Recent Labs  02/11/16 0954 02/12/16 0422  WBC 9.7 11.4*  HGB 15.9 15.6  HCT 47.5 47.4  MCV 89.0 89.9  PLT 155 154    Recent Labs  02/11/16 0953 02/11/16 1523 02/11/16 1858 02/11/16 2206  TROPONINI 0.04* 0.03 0.04* 0.04*   Invalid input(s): POCBNP No results for input(s): HGBA1C in the last 72 hours.   Weights: Filed Weights   02/11/16 1400 02/12/16 0518 02/13/16 0424  Weight: 260 lb (117.935 kg) 255 lb 14.4 oz (116.075 kg) 254 lb 0.2 oz (115.22 kg)     Radiology/Studies:  Dg Chest Portable 1 View  02/11/2016  CLINICAL DATA:  Shortness breath for 2 days.  Tachycardia. EXAM: PORTABLE CHEST 1 VIEW COMPARISON:  None. FINDINGS: Portable AP view of the chest was obtained. The cardiac silhouette is mildly enlarged but may be related to the AP projection. Interstitial lung  markings are mildly prominent. There is no focal airspace disease. Trachea is midline. Bone structures appear to be intact. Negative for a pneumothorax. IMPRESSION: Heart size is mildly prominent and there are prominent interstitial lung markings. Cannot exclude mild interstitial pulmonary edema. Electronically Signed   By: Markus Daft M.D.   On: 02/11/2016 10:17     Assessment and Recommendation  65 y.o. male with chronic atrial  fibrillation now with rapid ventricular rate and episode of acute congestive heart failure with edema and shortness of breath and no current evidence of myocardial infarction  Echocardiogram showing severe LV systolic dysfunction and dilation of the left ventricle with ejection fraction around 25% most consistent with tachycardic diffuse cardiomyopathy 1. No further cardiac intervention and diagnostics necessary at this time 2. Lasix for concerns of pulmonary edema and lower extremity edema 3. Continue warfarin for further risk reduction in stroke with atrial fibrillation with a goal INR between 2 and 3 4. Continue current dose of diltiazem and possible increased dose depending on ambulation today for better heart rate control with a goal heart rate at rest between 60 and 100 bpm 5. Okay for discharge to home with follow-up next week for further adjustments of medication management  Signed, Serafina Royals M.D. FACC

## 2016-02-13 NOTE — Progress Notes (Signed)
Patient is discharge home in a stable condition, summary and f/u care given to pt verbalized understanding

## 2016-02-13 NOTE — Care Management (Signed)
Afib and CHF. Patient states he is independent, active and drive. Lives at home with spouse. PCP is Dr. Bedelia Person. Denies issues obtaining medications, copays or transportation. No needs anticipated for discharge today.

## 2016-02-13 NOTE — Progress Notes (Signed)
ANTICOAGULATION CONSULT NOTE - follow up [place] for Warfarin Indication: atrial fibrillation  No Known Allergies  Patient Measurements: Height: 6' (182.9 cm) Weight: 254 lb 0.2 oz (115.22 kg) IBW/kg (Calculated) : 77.6  Vital Signs: Temp: 97.7 F (36.5 C) (05/17 1003) Temp Source: Oral (05/17 1003) BP: 128/79 mmHg (05/17 1122) Pulse Rate: 49 (05/17 1122)  Labs:  Recent Labs  02/11/16 0954 02/11/16 1033 02/11/16 1523 02/11/16 1858 02/11/16 2206 02/12/16 0422 02/13/16 0414  HGB 15.9  --   --   --   --  15.6  --   HCT 47.5  --   --   --   --  47.4  --   PLT 155  --   --   --   --  154  --   LABPROT 34.1*  --   --   --   --  35.5* 30.9*  INR 3.46  --   --   --   --  3.65 3.04  CREATININE  --  0.97  --   --   --  1.04 0.88  TROPONINI  --   --  0.03 0.04* 0.04*  --   --     Estimated Creatinine Clearance: 114 mL/min (by C-G formula based on Cr of 0.88).   Assessment: Patient with history of afib, admitted for afib with RVR. On coumadin 5mg  daily. Has already taken dose of coumadin this morning. INR: 3.46  5/15  INR 3.46  Patient had already taken home dose of 5mg  5/16  INR 3.65  No warfarin 5/17  INR 3.04  Goal of Therapy:  INR 2-3 Monitor platelets by anticoagulation protocol: Yes   Plan:  INR trending down still borderline high. Pt with discharge orders. Per MD, MD has already spoken with pt and informed him to skip today's dose of warfarin and resume his dose tomorrow. Planning INR follow up in 2 days per MD.   Rayna Sexton L 02/13/2016,1:42 PM

## 2016-02-13 NOTE — Progress Notes (Signed)
[facility] NAME: James Newman    MR#:  [id number]  DATE OF BIRTH:  1950-11-22  SUBJECTIVE:   Patient Seen earlier this morning and was not complaining shortness of breath or tachycardia. Since that time heart rate has been elevated on ambulation.  REVIEW OF SYSTEMS:    Review of Systems  Constitutional: Negative for fever, chills and malaise/fatigue.  HENT: Negative for ear discharge, ear pain, hearing loss, nosebleeds and sore throat.   Eyes: Negative for blurred vision and pain.  Respiratory: Negative for cough, hemoptysis and wheezing.   Cardiovascular: Positive for leg swelling (improved). Negative for chest pain and palpitations.  Gastrointestinal: Negative for nausea, vomiting, abdominal pain, diarrhea and blood in stool.  Genitourinary: Negative for dysuria.  Musculoskeletal: Negative for back pain.  Neurological: Negative for dizziness, tremors, speech change, focal weakness, seizures and headaches.  Endo/Heme/Allergies: Does not bruise/bleed easily.  Psychiatric/Behavioral: Negative for depression, suicidal ideas and hallucinations.    Tolerating Diet:yes      DRUG ALLERGIES:  No Known Allergies  VITALS:  Blood pressure 128/79, pulse 49, temperature 97.7 F (36.5 C), temperature source Oral, resp. rate 18, height 6' (1.829 m), weight 115.22 kg (254 lb 0.2 oz), SpO2 100 %.  PHYSICAL EXAMINATION:   Physical Exam  Constitutional: He is oriented to person, place, and time and well-developed, well-nourished, and in no distress. No distress.  HENT:  Head: Normocephalic.  Eyes: No scleral icterus.  Neck: Normal range of motion. Neck supple. No JVD present. No tracheal deviation present.  Cardiovascular: Normal rate and normal heart sounds.  Exam reveals no gallop and no friction rub.   No murmur heard. Irr, irr  Pulmonary/Chest: Effort normal. No respiratory distress. He has no wheezes. He has rales. He exhibits  no tenderness.  Abdominal: Soft. Bowel sounds are normal. He exhibits no distension and no mass. There is no tenderness. There is no rebound and no guarding.  Musculoskeletal: Normal range of motion. He exhibits edema.  Neurological: He is alert and oriented to person, place, and time.  Skin: Skin is warm. No rash noted. No erythema.  Psychiatric: Affect and judgment normal.      LABORATORY PANEL:   CBC  Recent Labs Lab 02/12/16 0422  WBC 11.4*  HGB 15.6  HCT 47.4  PLT 154   ------------------------------------------------------------------------------------------------------------------  Chemistries   Recent Labs Lab 02/11/16 1033  02/13/16 0414  NA 136  < > 139  K 3.9  < > 4.1  CL 106  < > 107  CO2 23  < > 27  GLUCOSE 108*  < > 108*  BUN 12  < > 17  CREATININE 0.97  < > 0.88  CALCIUM 8.9  < > 9.0  AST 27  --   --   ALT 23  --   --   ALKPHOS 42  --   --   BILITOT 3.2*  --   --   < > = values in this interval not displayed. ------------------------------------------------------------------------------------------------------------------  Cardiac Enzymes  Recent Labs Lab 02/11/16 1523 02/11/16 1858 02/11/16 2206  TROPONINI 0.03 0.04* 0.04*   ------------------------------------------------------------------------------------------------------------------  RADIOLOGY:  No results found.   ASSESSMENT AND PLAN:   65 year old male with a history of atrial fibrillation on Coumadin who presented in atrial fibrillation with RVR.  1. Atrial fibrillation with RVR: Patient is currently on diltiazem 240 mg daily. Metoprolol has been added as per recommendations by cardiology. He will continue Coumadin. Patient may  be able to be discharged later on today. Case was discussed with Dr. Nehemiah Massed depending on heart rate. Nurse to call cardiologist for discharge plan.  2. Acute systolic and diastolic heart failure due to atrial fibrillation with RVR: Cardiogram shows  EF of 25%. This is thought to be predominantly tachycardia-induced.  As per my conversation with cardiology, continue diltiazem.  Patient has been started on metoprolol, lisinopril and Lasix.  He will have close follow-up as an outpatient for a repeat echocardiogram as well as titration of medications.  Labbe troponin: This is due to demand ischemia from problem #1 and 2. This is not ACS.    Management plans discussed with the patient and he is in agreement.  CODE STATUS: FULL  TOTAL TIME TAKING CARE OF THIS PATIENT: 30 minutes.     POSSIBLE D/C today?? Depending on heart rate.  James Newman M.D on 02/13/2016 at 12:06 PM  Between 7am to 6pm - Pager - [phone] After 6pm go to [email or web address] - password [email or web address] [place] [facility]  Office  [phone]  CC: Primary care physician; Loistine Chance, MD  Note: This dictation was prepared with Dragon dictation along with smaller phrase technology. Any transcriptional errors that result from this process are unintentional.

## 2016-02-13 NOTE — Discharge Summary (Signed)
[facility] [place] NAME: James Newman    MR#:  [id number]  DATE OF BIRTH:  [birth year]  DATE OF ADMISSION:  02/11/2016 ADMITTING PHYSICIAN: Max Sane, MD  DATE OF DISCHARGE: *02/13/2016  4:30 PM  PRIMARY CARE PHYSICIAN: Loistine Chance, MD    ADMISSION DIAGNOSIS:  Pulmonary edema cardiac cause (HCC) [I50.1] Chronic atrial fibrillation ([place]) [I48.2] Atrial fibrillation with rapid ventricular response ([place]) [I48.91]  DISCHARGE DIAGNOSIS:  Active Problems:   Atrial fibrillation with rapid ventricular response ([place])   SECONDARY DIAGNOSIS:   Past Medical History  Diagnosis Date  . Tension headache   . Encounter for long-term (current) use of medications   . Anticoagulated on Coumadin   . Hyperlipidemia   . Hypotestosteronemia   . Lacking in energy   . Obesity, Class II, BMI 35-39.9, no comorbidity ([place])   . Impotence of organic origin   . Vitamin D deficiency   . Atrial fibrillation with normal ventricular rate [facility])     HOSPITAL COURSE:   65 year old male with a history of atrial fibrillation on Coumadin who presented in atrial fibrillation with RVR.  1.Long standing chronic Atrial fibrillation with RVR: Patient is currently on diltiazem 240 mg daily. Metoprolol has been added as per recommendations by cardiology. He will continue Coumadin. On this regimen his heart rate was better controlled both at rest and ambulation.  2. Acute systolic and diastolic heart failure due to atrial fibrillation with RVR: Echocardiogram shows EF of 25%. This is thought to be predominantly tachycardia-induced. We hope with controlled heart rate his EF will improve. As per my conversation with cardiology, patient will start on oral diltiazem, metoprolol, lisinopril and Lasix.  He will have close follow-up as an outpatient for a repeat echocardiogram in 3 months as well as upward titration of medications.   3. Elevated troponin: This is due to  demand ischemia from problem #1 and 2. This is not ACS.    DISCHARGE CONDITIONS AND DIET:   stable on heart healthy diet  CONSULTS OBTAINED:  Treatment Team:  Corey Skains, MD  DRUG ALLERGIES:  No Known Allergies  DISCHARGE MEDICATIONS:   Discharge Medication List as of 02/13/2016  3:08 PM    START taking these medications   Details  diltiazem (CARDIZEM CD) 240 MG 24 hr capsule Take 1 capsule (240 mg total) by mouth daily., Starting 02/13/2016, Until Discontinued, Normal    furosemide (LASIX) 40 MG tablet Take 1 tablet (40 mg total) by mouth daily., Starting 02/13/2016, Until Discontinued, Normal    lisinopril (PRINIVIL,ZESTRIL) 5 MG tablet Take 1 tablet (5 mg total) by mouth daily., Starting 02/13/2016, Until Discontinued, Normal    metoprolol succinate (TOPROL-XL) 50 MG 24 hr tablet Take 1 tablet (50 mg total) by mouth daily. Take with or immediately following a meal., Starting 02/13/2016, Until Discontinued, Normal      CONTINUE these medications which have CHANGED   Details  warfarin (COUMADIN) 5 MG tablet Take 1 tablet (5 mg total) by mouth daily. Take one tablet by mouth daily, Starting 02/13/2016, Until Discontinued, Fax      CONTINUE these medications which have NOT CHANGED   Details  cholecalciferol (VITAMIN D) 1000 units tablet Take 1,000 Units by mouth daily., Until Discontinued, Historical Med    vitamin B-12 (CYANOCOBALAMIN) 1000 MCG tablet Take 1,000 mcg by mouth daily., Until Discontinued, Historical Med      STOP taking these medications     Testosterone 12.5 MG/ACT (1%)  GEL               Today   CHIEF COMPLAINT:   patient doing better this am SOb resolved and LEE improved no chest pain or palpitations   VITAL SIGNS:  Blood pressure 128/79, pulse 49, temperature 97.7 F (36.5 C), temperature source Oral, resp. rate 18, height 6' (1.829 m), weight 115.22 kg (254 lb 0.2 oz), SpO2 100 %.   REVIEW OF SYSTEMS:  Review of Systems   Constitutional: Negative for fever, chills and malaise/fatigue.  HENT: Negative for ear discharge, ear pain, hearing loss, nosebleeds and sore throat.   Eyes: Negative for blurred vision and pain.  Respiratory: Negative for cough, hemoptysis, shortness of breath and wheezing.   Cardiovascular: Negative for chest pain, palpitations and leg swelling.  Gastrointestinal: Negative for nausea, vomiting, abdominal pain, diarrhea and blood in stool.  Genitourinary: Negative for dysuria.  Musculoskeletal: Negative for back pain.  Neurological: Negative for dizziness, tremors, speech change, focal weakness, seizures and headaches.  Endo/Heme/Allergies: Does not bruise/bleed easily.  Psychiatric/Behavioral: Negative for depression, suicidal ideas and hallucinations.     PHYSICAL EXAMINATION:  GENERAL:  65 y.o.-year-old patient lying in the bed with no acute distress.  NECK:  Supple, no jugular venous distention. No thyroid enlargement, no tenderness.  LUNGS: Normal breath sounds bilaterally, no wheezing, rales,rhonchi  No use of accessory muscles of respiration.  CARDIOVASCULAR: irr, irr. No murmurs, rubs, or gallops.  ABDOMEN: Soft, non-tender, non-distended. Bowel sounds present. No organomegaly or mass.  EXTREMITIES:minimal pedal edema, NO cyanosis, or clubbing.  PSYCHIATRIC: The patient is alert and oriented x 3.  SKIN: No obvious rash, lesion, or ulcer.   DATA REVIEW:   CBC  Recent Labs Lab 02/12/16 0422  WBC 11.4*  HGB 15.6  HCT 47.4  PLT 154    Chemistries   Recent Labs Lab 02/11/16 1033  02/13/16 0414  NA 136  < > 139  K 3.9  < > 4.1  CL 106  < > 107  CO2 23  < > 27  GLUCOSE 108*  < > 108*  BUN 12  < > 17  CREATININE 0.97  < > 0.88  CALCIUM 8.9  < > 9.0  AST 27  --   --   ALT 23  --   --   ALKPHOS 42  --   --   BILITOT 3.2*  --   --   < > = values in this interval not displayed.  Cardiac Enzymes  Recent Labs Lab 02/11/16 1523 02/11/16 1858 02/11/16 2206   TROPONINI 0.03 0.04* 0.04*    Microbiology Results  @MICRORSLT48 @  RADIOLOGY:  No results found.    Management plans discussed with the patient and he is in agreement. Stable for discharge  home  Patient should follow up with Dr Nehemiah Massed  CODE STATUS:     Code Status Orders        Start     Ordered   02/11/16 1342  Full code   Continuous     02/11/16 1341    Code Status History    Date Active Date Inactive Code Status Order ID Comments User Context   This patient has a current code status but no historical code status.      TOTAL TIME TAKING CARE OF THIS PATIENT: 36 minutes.    Note: This dictation was prepared with Dragon dictation along with smaller phrase technology. Any transcriptional errors that result from this process are unintentional.  Baylon Santelli  M.D on 02/13/2016 at 5:08 PM  Between 7am to 6pm - Pager - [phone] After 6pm go to [email or web address] - password [email or web address] [place] [facility]  Office  [phone]  CC: Primary care physician; Loistine Chance, MD

## 2016-02-21 ENCOUNTER — Ambulatory Visit (INDEPENDENT_AMBULATORY_CARE_PROVIDER_SITE_OTHER): Payer: 59 | Admitting: Family Medicine

## 2016-02-21 ENCOUNTER — Encounter: Payer: Self-pay | Admitting: Family Medicine

## 2016-02-21 VITALS — BP 108/88 | HR 58 | Temp 97.3°F | Resp 14 | Ht 72.0 in | Wt 236.8 lb

## 2016-02-21 DIAGNOSIS — Z09 Encounter for follow-up examination after completed treatment for conditions other than malignant neoplasm: Secondary | ICD-10-CM

## 2016-02-21 DIAGNOSIS — I4891 Unspecified atrial fibrillation: Secondary | ICD-10-CM | POA: Diagnosis not present

## 2016-02-21 DIAGNOSIS — I5030 Unspecified diastolic (congestive) heart failure: Secondary | ICD-10-CM | POA: Diagnosis not present

## 2016-02-21 DIAGNOSIS — I5020 Unspecified systolic (congestive) heart failure: Secondary | ICD-10-CM | POA: Insufficient documentation

## 2016-02-21 NOTE — Progress Notes (Addendum)
Name: James Newman   MRN: [id number]    DOB: 17-Oct-1950   Date:02/21/2016       Progress Note  Subjective  Chief Complaint  Chief Complaint  Patient presents with  . Hospitalization Follow-up    02/11/16 through 02/12/2017 while in the ER they performed Echocardiogram, patient wore heart rate monitor while walking for 20 minutes, and gave him fluid  . Congestive Heart Failure    Patient states he feels much better but would like to discuss being taken off some of the medications the ER prescribed.     [facility] discharge follow up: he was seen here on Monday May 16th he was in our office for SOB and swelling, found to be in rapid ventricular response afib, EMS was activated, patient was transported to [facility] and was diagnosed with high demand elevation of troponin, DHF, EF 20-25 %, he is on diuretic, calcium channel and beta-blocker, still on coumadin. He is tolerating medication well, except for some somnolence if sitting still. Denies orthopnea, SOB, no chest pain or palpitation.    Patient Active Problem List   Diagnosis Date Noted  . Diastolic CHF ([place]) 85/46/2703  . Atrial fibrillation with rapid ventricular response ([place]) 02/11/2016  . 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 Encounter for therapeutic drug level monitoring 03/14/2015  . 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 B12 deficiency 03/14/2015  . Dyslipidemia 03/14/2015  . 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 Obesity (BMI 30-39.9) 03/14/2015  . Vitamin D deficiency 03/14/2015  . A-fib ([place]) 03/08/2007    Past Surgical History  Procedure Laterality Date  . Nasal septum surgery      Family History  Problem Relation Age of Onset  . COPD Mother   . Hypertension Son   . Obesity Son     Social History   Social History  . Marital Status: Married    Spouse Name: N/A  . Number of Children: N/A  . Years  of Education: N/A   Occupational History  . Not on file.   Social History Main Topics  . Smoking status: Never Smoker   . Smokeless tobacco: Never Used  . Alcohol Use: 1.2 oz/week    2 Standard drinks or equivalent per week  . Drug Use: No  . Sexual Activity:    Partners: Female   Other Topics Concern  . Not on file   Social History Narrative     Current outpatient prescriptions:  .  cholecalciferol (VITAMIN D) 1000 units tablet, Take 1,000 Units by mouth daily., Disp: , Rfl:  .  diltiazem (CARDIZEM CD) 240 MG 24 hr capsule, Take 1 capsule (240 mg total) by mouth daily., Disp: 30 capsule, Rfl: 0 .  furosemide (LASIX) 40 MG tablet, Take 1 tablet (40 mg total) by mouth daily., Disp: 30 tablet, Rfl: 0 .  lisinopril (PRINIVIL,ZESTRIL) 5 MG tablet, Take 1 tablet (5 mg total) by mouth daily., Disp: 30 tablet, Rfl: 0 .  metoprolol succinate (TOPROL-XL) 50 MG 24 hr tablet, Take 1 tablet (50 mg total) by mouth daily. Take with or immediately following a meal., Disp: 30 tablet, Rfl: 0 .  vitamin B-12 (CYANOCOBALAMIN) 1000 MCG tablet, Take 1,000 mcg by mouth daily., Disp: , Rfl:  .  warfarin (COUMADIN) 5 MG tablet, Take 1 tablet (5 mg total) by mouth daily. Take one tablet by mouth daily, Disp: 45 tablet, Rfl: 5  No Known  Allergies   ROS  Constitutional: Negative for fever , positive for  weight change - loss.  Respiratory: Negative for cough and shortness of breath.   Cardiovascular: Negative for chest pain or palpitations.  Gastrointestinal: Negative for abdominal pain, no bowel changes.  Musculoskeletal: Negative for gait problem or joint swelling.  Skin: Negative for rash.  Neurological: Negative for dizziness or headache.  No other specific complaints in a complete review of systems (except as listed in HPI above).  Objective  Filed Vitals:   02/21/16 1133  BP: 108/88  Pulse: 58  Temp: 97.3 F (36.3 C)  TempSrc: Oral  Resp: 14  Height: 6' (1.829 m)  Weight: 236 lb  12.8 oz (107.412 kg)  SpO2: 97%    Body mass index is 32.11 kg/(m^2).  Physical Exam  Constitutional: Patient appears well-developed and well-nourished. Obese  No distress.  HEENT: head atraumatic, normocephalic, pupils equal and reactive to light, neck supple, throat within normal limits Cardiovascular: Normal rate, irregular rhythm but  normal heart sounds.  No murmur heard. No BLE edema. Pulmonary/Chest: Effort normal and breath sounds normal. No respiratory distress. Abdominal: Soft.  There is no tenderness. Psychiatric: Patient has a normal mood and affect. behavior is normal. Judgment and thought content normal. Skin: stasis dermatitis on legs  Recent Results (from the past 2160 hour(s))  Troponin I     Status: Abnormal   Collection Time: 02/11/16  9:53 AM  Result Value Ref Range   Troponin I 0.04 (H) <0.031 ng/mL    Comment: READ BACK AND VERIFIED WITH HEATHER FISHER 02/11/16 1103 SGD        PERSISTENTLY INCREASED TROPONIN VALUES IN THE RANGE OF 0.04-0.49 ng/mL CAN BE SEEN IN:       -UNSTABLE ANGINA       -CONGESTIVE HEART FAILURE       -MYOCARDITIS       -CHEST TRAUMA       -ARRYHTHMIAS       -LATE PRESENTING MYOCARDIAL INFARCTION       -COPD   CLINICAL FOLLOW-UP RECOMMENDED.   CBC     Status: Abnormal   Collection Time: 02/11/16  9:54 AM  Result Value Ref Range   WBC 9.7 3.8 - 10.6 K/uL   RBC 5.33 4.40 - 5.90 MIL/uL   Hemoglobin 15.9 13.0 - 18.0 g/dL   HCT 47.5 40.0 - 52.0 %   MCV 89.0 80.0 - 100.0 fL   MCH 29.8 26.0 - 34.0 pg   MCHC 33.5 32.0 - 36.0 g/dL   RDW 16.1 (H) 11.5 - 14.5 %   Platelets 155 150 - 440 K/uL  Brain natriuretic peptide     Status: Abnormal   Collection Time: 02/11/16  9:54 AM  Result Value Ref Range   B Natriuretic Peptide 320.0 (H) 0.0 - 100.0 pg/mL  Protime-INR     Status: Abnormal   Collection Time: 02/11/16  9:54 AM  Result Value Ref Range   Prothrombin Time 34.1 (H) 11.4 - 15.0 seconds   INR 3.46   Comprehensive metabolic  panel     Status: Abnormal   Collection Time: 02/11/16 10:33 AM  Result Value Ref Range   Sodium 136 135 - 145 mmol/L   Potassium 3.9 3.5 - 5.1 mmol/L   Chloride 106 101 - 111 mmol/L   CO2 23 22 - 32 mmol/L   Glucose, Bld 108 (H) 65 - 99 mg/dL   BUN 12 6 - 20 mg/dL   Creatinine, Ser 0.97 0.61 -  1.24 mg/dL   Calcium 8.9 8.9 - 10.3 mg/dL   Total Protein 7.0 6.5 - 8.1 g/dL   Albumin 3.7 3.5 - 5.0 g/dL   AST 27 15 - 41 U/L   ALT 23 17 - 63 U/L   Alkaline Phosphatase 42 38 - 126 U/L   Total Bilirubin 3.2 (H) 0.3 - 1.2 mg/dL   GFR calc non Af Amer >60 >60 mL/min   GFR calc Af Amer >60 >60 mL/min    Comment: (NOTE) The eGFR has been calculated using the CKD EPI equation. This calculation has not been validated in all clinical situations. eGFR's persistently <60 mL/min signify possible Chronic Kidney Disease.    Anion gap 7 5 - 15  TSH     Status: None   Collection Time: 02/11/16  3:23 PM  Result Value Ref Range   TSH 1.610 0.350 - 4.500 uIU/mL  Troponin I     Status: None   Collection Time: 02/11/16  3:23 PM  Result Value Ref Range   Troponin I 0.03 <0.031 ng/mL    Comment:        NO INDICATION OF MYOCARDIAL INJURY.   Troponin I     Status: Abnormal   Collection Time: 02/11/16  6:58 PM  Result Value Ref Range   Troponin I 0.04 (H) <0.031 ng/mL    Comment: PREVIOUS RESULT CALLED ON 02/11/16 AT 1103 BY SGD...[facility]        PERSISTENTLY INCREASED TROPONIN VALUES IN THE RANGE OF 0.04-0.49 ng/mL CAN BE SEEN IN:       -UNSTABLE ANGINA       -CONGESTIVE HEART FAILURE       -MYOCARDITIS       -CHEST TRAUMA       -ARRYHTHMIAS       -LATE PRESENTING MYOCARDIAL INFARCTION       -COPD   CLINICAL FOLLOW-UP RECOMMENDED.   ECHO COMPLETE     Status: None   Collection Time: 02/11/16  8:30 PM  Result Value Ref Range   Weight 4160 oz   Height 72 in   BP 119/74 mmHg  Troponin I     Status: Abnormal   Collection Time: 02/11/16 10:06 PM  Result Value Ref Range   Troponin I 0.04 (H)  <0.031 ng/mL    Comment: PREVIOUS RESULT CALLED BY SGD @ 8657 ON 02/11/2016 [facility]        PERSISTENTLY INCREASED TROPONIN VALUES IN THE RANGE OF 0.04-0.49 ng/mL CAN BE SEEN IN:       -UNSTABLE ANGINA       -CONGESTIVE HEART FAILURE       -MYOCARDITIS       -CHEST TRAUMA       -ARRYHTHMIAS       -LATE PRESENTING MYOCARDIAL INFARCTION       -COPD   CLINICAL FOLLOW-UP RECOMMENDED.   Basic metabolic panel     Status: Abnormal   Collection Time: 02/12/16  4:22 AM  Result Value Ref Range   Sodium 138 135 - 145 mmol/L   Potassium 4.5 3.5 - 5.1 mmol/L   Chloride 106 101 - 111 mmol/L   CO2 25 22 - 32 mmol/L   Glucose, Bld 116 (H) 65 - 99 mg/dL   BUN 16 6 - 20 mg/dL   Creatinine, Ser 1.04 0.61 - 1.24 mg/dL   Calcium 9.0 8.9 - 10.3 mg/dL   GFR calc non Af Amer >60 >60 mL/min   GFR calc Af Amer >60 >60 mL/min  Comment: (NOTE) The eGFR has been calculated using the CKD EPI equation. This calculation has not been validated in all clinical situations. eGFR's persistently <60 mL/min signify possible Chronic Kidney Disease.    Anion gap 7 5 - 15  CBC     Status: Abnormal   Collection Time: 02/12/16  4:22 AM  Result Value Ref Range   WBC 11.4 (H) 3.8 - 10.6 K/uL   RBC 5.28 4.40 - 5.90 MIL/uL   Hemoglobin 15.6 13.0 - 18.0 g/dL   HCT 47.4 40.0 - 52.0 %   MCV 89.9 80.0 - 100.0 fL   MCH 29.5 26.0 - 34.0 pg   MCHC 32.8 32.0 - 36.0 g/dL   RDW 16.4 (H) 11.5 - 14.5 %   Platelets 154 150 - 440 K/uL  Lipid panel     Status: Abnormal   Collection Time: 02/12/16  4:22 AM  Result Value Ref Range   Cholesterol 139 0 - 200 mg/dL   Triglycerides 62 <150 mg/dL   HDL 30 (L) >40 mg/dL   Total CHOL/HDL Ratio 4.6 RATIO   VLDL 12 0 - 40 mg/dL   LDL Cholesterol 97 0 - 99 mg/dL    Comment:        Total Cholesterol/HDL:CHD Risk Coronary Heart Disease Risk Table                     Men   Women  1/2 Average Risk   3.4   3.3  Average Risk       5.0   4.4  2 X Average Risk   9.6   7.1  3 X Average  Risk  23.4   11.0        Use the calculated Patient Ratio above and the CHD Risk Table to determine the patient's CHD Risk.        ATP III CLASSIFICATION (LDL):  <100     mg/dL   Optimal  100-129  mg/dL   Near or Above                    Optimal  130-159  mg/dL   Borderline  160-189  mg/dL   High  >190     mg/dL   Very High   Protime-INR     Status: Abnormal   Collection Time: 02/12/16  4:22 AM  Result Value Ref Range   Prothrombin Time 35.5 (H) 11.4 - 15.0 seconds   INR 3.65   Protime-INR     Status: Abnormal   Collection Time: 02/13/16  4:14 AM  Result Value Ref Range   Prothrombin Time 30.9 (H) 11.4 - 15.0 seconds   INR 4.70   Basic metabolic panel     Status: Abnormal   Collection Time: 02/13/16  4:14 AM  Result Value Ref Range   Sodium 139 135 - 145 mmol/L   Potassium 4.1 3.5 - 5.1 mmol/L   Chloride 107 101 - 111 mmol/L   CO2 27 22 - 32 mmol/L   Glucose, Bld 108 (H) 65 - 99 mg/dL   BUN 17 6 - 20 mg/dL   Creatinine, Ser 0.88 0.61 - 1.24 mg/dL   Calcium 9.0 8.9 - 10.3 mg/dL   GFR calc non Af Amer >60 >60 mL/min   GFR calc Af Amer >60 >60 mL/min    Comment: (NOTE) The eGFR has been calculated using the CKD EPI equation. This calculation has not been validated in all clinical situations. eGFR's persistently <60 mL/min  signify possible Chronic Kidney Disease.    Anion gap 5 5 - 15      PHQ2/9: Depression screen [facility] 2/9 02/11/2016 11/22/2015 08/29/2015 03/15/2015  Decreased Interest 0 0 0 0  Down, Depressed, Hopeless 0 0 0 0  PHQ - 2 Score 0 0 0 0     Fall Risk: Fall Risk  02/11/2016 11/22/2015 08/29/2015 03/15/2015  Falls in the past year? No No No No    Assessment & Plan  1. Atrial fibrillation with rapid ventricular response (HCC)  Rate controlled now, feeling well, tolerating medication well, except that he feels sleepy when he sits around  2. Diastolic congestive heart failure, unspecified congestive heart failure chronicity (HCC)  Taking lasix, on  ace and beta-blocker  3. Hospital discharge follow-up  Doing well, will see Dr. Nehemiah Massed today, HR is low and bp is a little lower than usual, but no side effects of orthostatic changes

## 2016-03-12 ENCOUNTER — Ambulatory Visit (INDEPENDENT_AMBULATORY_CARE_PROVIDER_SITE_OTHER): Payer: 59 | Admitting: Family Medicine

## 2016-03-12 ENCOUNTER — Encounter: Payer: Self-pay | Admitting: Family Medicine

## 2016-03-12 VITALS — BP 102/60 | HR 65 | Temp 97.8°F | Resp 12 | Ht 72.0 in | Wt 235.9 lb

## 2016-03-12 DIAGNOSIS — I482 Chronic atrial fibrillation, unspecified: Secondary | ICD-10-CM

## 2016-03-12 DIAGNOSIS — I5022 Chronic systolic (congestive) heart failure: Secondary | ICD-10-CM | POA: Diagnosis not present

## 2016-03-12 DIAGNOSIS — E291 Testicular hypofunction: Secondary | ICD-10-CM | POA: Diagnosis not present

## 2016-03-12 MED ORDER — DILTIAZEM HCL ER COATED BEADS 240 MG PO CP24: 240.0000 mg | ORAL_CAPSULE | Freq: Every day | ORAL | Status: DC

## 2016-03-12 MED ORDER — FUROSEMIDE 40 MG PO TABS: 40.0000 mg | ORAL_TABLET | Freq: Every day | ORAL | Status: DC

## 2016-03-12 MED ORDER — METOPROLOL SUCCINATE ER 50 MG PO TB24: 50.0000 mg | ORAL_TABLET | Freq: Every day | ORAL | Status: DC

## 2016-03-12 MED ORDER — LISINOPRIL 5 MG PO TABS: 5.0000 mg | ORAL_TABLET | Freq: Every day | ORAL | Status: DC

## 2016-03-12 MED ORDER — TESTOSTERONE 12.5 MG/ACT (1%) TD GEL: 50.0000 mg | Freq: Every day | TRANSDERMAL | Status: DC

## 2016-03-12 NOTE — Progress Notes (Signed)
Name: James Newman   MRN: [id number]    DOB: 06-19-1951   Date:03/12/2016       Progress Note  Subjective  Chief Complaint  Chief Complaint  Patient presents with  . Follow-up    patient is here for his 54-monthf/u  . Medication Refill    patient has a f/u with his cardiologist next week and needs a refill of the meds given to him in the hospital    HPI   Chronic Atrial Fibrillation: he has paroxysmal afib for many years and is on coumadin. One month ago  he developed some SOB with activity and when he came in he was on rapid ventricular rate, [facility]F. He has been taking medications as prescribed, seen by Dr. KNehemiah Massedand has follow up next week with him. He denies SOB with mild activity - such walking or showering. No chest pain, no palpitation and no current leg swelling or orthopnea. He is on coumadin.   Hypogonadism: he has been doing well on Testosterone gel, but insurance is denying coverage. He was off medication for about one year and when without testosterone he gained weight and was very tired also lack of libido. He feels much better on medication and we will try a PA. Currently still has some left and is using 50 g daily   Patient Active Problem List   Diagnosis Date Noted  . Systolic CHF ([place] 063/78/5885 . [place]al affective disorder ([place] 11/22/2015  . 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 Encounter for therapeutic drug level monitoring 03/14/2015  . 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 B12 deficiency 03/14/2015  . Dyslipidemia 03/14/2015  . 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 Obesity (BMI 30-39.9) 03/14/2015  . Vitamin D deficiency 03/14/2015  . Chronic a-fib ([place] 03/08/2007    Past Surgical History  Procedure Laterality Date  . Nasal septum surgery      Family History  Problem Relation Age of Onset  . COPD Mother   . Hypertension Son    . Obesity Son     Social History   Social History  . Marital Status: Married    Spouse Name: N/A  . Number of Children: N/A  . Years of Education: N/A   Occupational History  . Not on file.   Social History Main Topics  . Smoking status: Never Smoker   . Smokeless tobacco: Never Used  . Alcohol Use: 1.2 oz/week    2 Standard drinks or equivalent per week  . Drug Use: No  . Sexual Activity:    Partners: Female   Other Topics Concern  . Not on file   Social History Narrative     Current outpatient prescriptions:  .  cholecalciferol (VITAMIN D) 1000 units tablet, Take 1,000 Units by mouth daily., Disp: , Rfl:  .  diltiazem (CARDIZEM CD) 240 MG 24 hr capsule, Take 1 capsule (240 mg total) by mouth daily., Disp: 30 capsule, Rfl: 0 .  furosemide (LASIX) 40 MG tablet, Take 1 tablet (40 mg total) by mouth daily., Disp: 30 tablet, Rfl: 0 .  lisinopril (PRINIVIL,ZESTRIL) 5 MG tablet, Take 1 tablet (5 mg total) by mouth daily., Disp: 30 tablet, Rfl: 0 .  metoprolol succinate (TOPROL-XL) 50 MG 24 hr tablet, Take 1 tablet (50 mg total) by mouth daily. Take with or immediately following a meal., Disp: 30 tablet, Rfl: 0 .  Testosterone (ANDROGEL PUMP) 12.5 MG/ACT (1%) GEL, Place 50 mg onto the skin daily., Disp: 2 Bottle, Rfl: 2 .  vitamin B-12 (CYANOCOBALAMIN) 1000 MCG tablet, Take 1,000 mcg by mouth daily., Disp: , Rfl:  .  warfarin (COUMADIN) 5 MG tablet, Take 1 tablet (5 mg total) by mouth daily. Take one tablet by mouth daily, Disp: 45 tablet, Rfl: 5  No Known Allergies   ROS  Ten systems reviewed and is negative except as mentioned in HPI   Objective  Filed Vitals:   03/12/16 1153  BP: 102/60  Pulse: 65  Temp: 97.8 F (36.6 C)  TempSrc: Oral  Resp: 12  Height: 6' (1.829 m)  Weight: 235 lb 14.4 oz (107.004 kg)  SpO2: 96%    Body mass index is 31.99 kg/(m^2).  Physical Exam  Constitutional: Patient appears well-developed and well-nourished. Obese  No distress.   HEENT: head atraumatic, normocephalic, pupils equal and reactive to light,  neck supple, throat within normal limits Cardiovascular: Normal rate, irregular rhythm and normal heart sounds.  No murmur heard. No BLE edema. Pulmonary/Chest: Effort normal and breath sounds normal. No respiratory distress. Abdominal: Soft.  There is no tenderness. Psychiatric: Patient has a normal mood and affect. behavior is normal. Judgment and thought content normal.  Recent Results (from the past 2160 hour(s))  Troponin I     Status: Abnormal   Collection Time: 02/11/16  9:53 AM  Result Value Ref Range   Troponin I 0.04 (H) <0.031 ng/mL    Comment: READ BACK AND VERIFIED WITH HEATHER FISHER 02/11/16 1103 SGD        PERSISTENTLY INCREASED TROPONIN VALUES IN THE RANGE OF 0.04-0.49 ng/mL CAN BE SEEN IN:       -UNSTABLE ANGINA       -CONGESTIVE HEART FAILURE       -MYOCARDITIS       -CHEST TRAUMA       -ARRYHTHMIAS       -LATE PRESENTING MYOCARDIAL INFARCTION       -COPD   CLINICAL FOLLOW-UP RECOMMENDED.   CBC     Status: Abnormal   Collection Time: 02/11/16  9:54 AM  Result Value Ref Range   WBC 9.7 3.8 - 10.6 K/uL   RBC 5.33 4.40 - 5.90 MIL/uL   Hemoglobin 15.9 13.0 - 18.0 g/dL   HCT 47.5 40.0 - 52.0 %   MCV 89.0 80.0 - 100.0 fL   MCH 29.8 26.0 - 34.0 pg   MCHC 33.5 32.0 - 36.0 g/dL   RDW 16.1 (H) 11.5 - 14.5 %   Platelets 155 150 - 440 K/uL  Brain natriuretic peptide     Status: Abnormal   Collection Time: 02/11/16  9:54 AM  Result Value Ref Range   B Natriuretic Peptide 320.0 (H) 0.0 - 100.0 pg/mL  Protime-INR     Status: Abnormal   Collection Time: 02/11/16  9:54 AM  Result Value Ref Range   Prothrombin Time 34.1 (H) 11.4 - 15.0 seconds   INR 3.46   Comprehensive metabolic panel     Status: Abnormal   Collection Time: 02/11/16 10:33 AM  Result Value Ref Range   Sodium 136 135 - 145 mmol/L   Potassium 3.9 3.5 - 5.1 mmol/L   Chloride 106 101 - 111 mmol/L   CO2 23 22 - 32 mmol/L    Glucose, Bld 108 (H) 65 - 99 mg/dL   BUN 12 6 - 20 mg/dL   Creatinine, Ser 0.97 0.61 - 1.24 mg/dL  Calcium 8.9 8.9 - 10.3 mg/dL   Total Protein 7.0 6.5 - 8.1 g/dL   Albumin 3.7 3.5 - 5.0 g/dL   AST 27 15 - 41 U/L   ALT 23 17 - 63 U/L   Alkaline Phosphatase 42 38 - 126 U/L   Total Bilirubin 3.2 (H) 0.3 - 1.2 mg/dL   GFR calc non Af Amer >60 >60 mL/min   GFR calc Af Amer >60 >60 mL/min    Comment: (NOTE) The eGFR has been calculated using the CKD EPI equation. This calculation has not been validated in all clinical situations. eGFR's persistently <60 mL/min signify possible Chronic Kidney Disease.    Anion gap 7 5 - 15  TSH     Status: None   Collection Time: 02/11/16  3:23 PM  Result Value Ref Range   TSH 1.610 0.350 - 4.500 uIU/mL  Troponin I     Status: None   Collection Time: 02/11/16  3:23 PM  Result Value Ref Range   Troponin I 0.03 <0.031 ng/mL    Comment:        NO INDICATION OF MYOCARDIAL INJURY.   Troponin I     Status: Abnormal   Collection Time: 02/11/16  6:58 PM  Result Value Ref Range   Troponin I 0.04 (H) <0.031 ng/mL    Comment: PREVIOUS RESULT CALLED ON 02/11/16 AT 1103 BY SGD...[facility]        PERSISTENTLY INCREASED TROPONIN VALUES IN THE RANGE OF 0.04-0.49 ng/mL CAN BE SEEN IN:       -UNSTABLE ANGINA       -CONGESTIVE HEART FAILURE       -MYOCARDITIS       -CHEST TRAUMA       -ARRYHTHMIAS       -LATE PRESENTING MYOCARDIAL INFARCTION       -COPD   CLINICAL FOLLOW-UP RECOMMENDED.   ECHO COMPLETE     Status: None   Collection Time: 02/11/16  8:30 PM  Result Value Ref Range   Weight 4160 oz   Height 72 in   BP 119/74 mmHg  Troponin I     Status: Abnormal   Collection Time: 02/11/16 10:06 PM  Result Value Ref Range   Troponin I 0.04 (H) <0.031 ng/mL    Comment: PREVIOUS RESULT CALLED BY SGD @ 5638 ON 02/11/2016 [facility]        PERSISTENTLY INCREASED TROPONIN VALUES IN THE RANGE OF 0.04-0.49 ng/mL CAN BE SEEN IN:       -UNSTABLE ANGINA        -CONGESTIVE HEART FAILURE       -MYOCARDITIS       -CHEST TRAUMA       -ARRYHTHMIAS       -LATE PRESENTING MYOCARDIAL INFARCTION       -COPD   CLINICAL FOLLOW-UP RECOMMENDED.   Basic metabolic panel     Status: Abnormal   Collection Time: 02/12/16  4:22 AM  Result Value Ref Range   Sodium 138 135 - 145 mmol/L   Potassium 4.5 3.5 - 5.1 mmol/L   Chloride 106 101 - 111 mmol/L   CO2 25 22 - 32 mmol/L   Glucose, Bld 116 (H) 65 - 99 mg/dL   BUN 16 6 - 20 mg/dL   Creatinine, Ser 1.04 0.61 - 1.24 mg/dL   Calcium 9.0 8.9 - 10.3 mg/dL   GFR calc non Af Amer >60 >60 mL/min   GFR calc Af Amer >60 >60 mL/min    Comment: (NOTE) The  eGFR has been calculated using the CKD EPI equation. This calculation has not been validated in all clinical situations. eGFR's persistently <60 mL/min signify possible Chronic Kidney Disease.    Anion gap 7 5 - 15  CBC     Status: Abnormal   Collection Time: 02/12/16  4:22 AM  Result Value Ref Range   WBC 11.4 (H) 3.8 - 10.6 K/uL   RBC 5.28 4.40 - 5.90 MIL/uL   Hemoglobin 15.6 13.0 - 18.0 g/dL   HCT 47.4 40.0 - 52.0 %   MCV 89.9 80.0 - 100.0 fL   MCH 29.5 26.0 - 34.0 pg   MCHC 32.8 32.0 - 36.0 g/dL   RDW 16.4 (H) 11.5 - 14.5 %   Platelets 154 150 - 440 K/uL  Lipid panel     Status: Abnormal   Collection Time: 02/12/16  4:22 AM  Result Value Ref Range   Cholesterol 139 0 - 200 mg/dL   Triglycerides 62 <150 mg/dL   HDL 30 (L) >40 mg/dL   Total CHOL/HDL Ratio 4.6 RATIO   VLDL 12 0 - 40 mg/dL   LDL Cholesterol 97 0 - 99 mg/dL    Comment:        Total Cholesterol/HDL:CHD Risk Coronary Heart Disease Risk Table                     Men   Women  1/2 Average Risk   3.4   3.3  Average Risk       5.0   4.4  2 X Average Risk   9.6   7.1  3 X Average Risk  23.4   11.0        Use the calculated Patient Ratio above and the CHD Risk Table to determine the patient's CHD Risk.        ATP III CLASSIFICATION (LDL):  <100     mg/dL   Optimal  100-129  mg/dL    Near or Above                    Optimal  130-159  mg/dL   Borderline  160-189  mg/dL   High  >190     mg/dL   Very High   Protime-INR     Status: Abnormal   Collection Time: 02/12/16  4:22 AM  Result Value Ref Range   Prothrombin Time 35.5 (H) 11.4 - 15.0 seconds   INR 3.65   Protime-INR     Status: Abnormal   Collection Time: 02/13/16  4:14 AM  Result Value Ref Range   Prothrombin Time 30.9 (H) 11.4 - 15.0 seconds   INR 2.02   Basic metabolic panel     Status: Abnormal   Collection Time: 02/13/16  4:14 AM  Result Value Ref Range   Sodium 139 135 - 145 mmol/L   Potassium 4.1 3.5 - 5.1 mmol/L   Chloride 107 101 - 111 mmol/L   CO2 27 22 - 32 mmol/L   Glucose, Bld 108 (H) 65 - 99 mg/dL   BUN 17 6 - 20 mg/dL   Creatinine, Ser 0.88 0.61 - 1.24 mg/dL   Calcium 9.0 8.9 - 10.3 mg/dL   GFR calc non Af Amer >60 >60 mL/min   GFR calc Af Amer >60 >60 mL/min    Comment: (NOTE) The eGFR has been calculated using the CKD EPI equation. This calculation has not been validated in all clinical situations. eGFR's persistently <60 mL/min signify possible Chronic  Kidney Disease.    Anion gap 5 5 - 15      PHQ2/9: Depression screen [facility] 2/9 03/12/2016 02/11/2016 11/22/2015 08/29/2015 03/15/2015  Decreased Interest 0 0 0 0 0  Down, Depressed, Hopeless 0 0 0 0 0  PHQ - 2 Score 0 0 0 0 0     Fall Risk: Fall Risk  03/12/2016 02/11/2016 11/22/2015 08/29/2015 03/15/2015  Falls in the past year? No No No No No     Functional Status Survey: Is the patient deaf or have difficulty hearing?: No Does the patient have difficulty seeing, even when wearing glasses/contacts?: No Does the patient have difficulty concentrating, remembering, or making decisions?: No Does the patient have difficulty walking or climbing stairs?: No Does the patient have difficulty dressing or bathing?: No Does the patient have difficulty doing errands alone such as visiting a doctor's office or shopping?:  No    Assessment & Plan  1. Chronic a-fib (HCC)  - metoprolol succinate (TOPROL-XL) 50 MG 24 hr tablet; Take 1 tablet (50 mg total) by mouth daily. Take with or immediately following a meal.  Dispense: 30 tablet; Refill: 0 - diltiazem (CARDIZEM CD) 240 MG 24 hr capsule; Take 1 capsule (240 mg total) by mouth daily.  Dispense: 30 capsule; Refill: 0  2. Chronic systolic CHF (congestive heart failure), NYHA class 1 (HCC)  - metoprolol succinate (TOPROL-XL) 50 MG 24 hr tablet; Take 1 tablet (50 mg total) by mouth daily. Take with or immediately following a meal.  Dispense: 30 tablet; Refill: 0 - lisinopril (PRINIVIL,ZESTRIL) 5 MG tablet; Take 1 tablet (5 mg total) by mouth daily.  Dispense: 30 tablet; Refill: 0 - furosemide (LASIX) 40 MG tablet; Take 1 tablet (40 mg total) by mouth daily.  Dispense: 30 tablet; Refill: 0  3. Testicular hypofunction  - Testosterone (ANDROGEL PUMP) 12.5 MG/ACT (1%) GEL; Place 50 mg onto the skin daily.  Dispense: 2 Bottle; Refill: 2

## 2016-03-20 DIAGNOSIS — I5022 Chronic systolic (congestive) heart failure: Secondary | ICD-10-CM | POA: Insufficient documentation

## 2016-04-10 ENCOUNTER — Telehealth: Payer: Self-pay | Admitting: Family Medicine

## 2016-04-10 DIAGNOSIS — I5022 Chronic systolic (congestive) heart failure: Secondary | ICD-10-CM

## 2016-04-10 DIAGNOSIS — I482 Chronic atrial fibrillation, unspecified: Secondary | ICD-10-CM

## 2016-04-10 MED ORDER — LISINOPRIL 5 MG PO TABS: 5.0000 mg | ORAL_TABLET | Freq: Every day | ORAL | Status: DC

## 2016-04-10 MED ORDER — METOPROLOL SUCCINATE ER 50 MG PO TB24: 50.0000 mg | ORAL_TABLET | Freq: Every day | ORAL | Status: AC

## 2016-04-10 MED ORDER — FUROSEMIDE 40 MG PO TABS: 40.0000 mg | ORAL_TABLET | Freq: Every day | ORAL | Status: AC

## 2016-04-10 MED ORDER — DILTIAZEM HCL ER COATED BEADS 240 MG PO CP24: 240.0000 mg | ORAL_CAPSULE | Freq: Every day | ORAL | Status: DC

## 2016-04-10 NOTE — Telephone Encounter (Signed)
Requesting refills on Diltiazem 240mg ; furosemide 40mg , lisinopril 5mg , metoprolol 50mg , warfarin 5mg . Please send to [facility]. Patient did cancel his appointment for September because he was able to get in to see Cardiologist

## 2016-05-21 ENCOUNTER — Ambulatory Visit: Payer: Managed Care, Other (non HMO) | Admitting: Family Medicine

## 2016-06-12 ENCOUNTER — Ambulatory Visit: Payer: 59 | Admitting: Family Medicine

## 2016-10-09 ENCOUNTER — Other Ambulatory Visit: Payer: Self-pay

## 2016-10-09 ENCOUNTER — Telehealth: Payer: Self-pay | Admitting: Family Medicine

## 2016-10-09 DIAGNOSIS — I482 Chronic atrial fibrillation, unspecified: Secondary | ICD-10-CM

## 2016-10-09 DIAGNOSIS — I5022 Chronic systolic (congestive) heart failure: Secondary | ICD-10-CM

## 2016-10-09 NOTE — Telephone Encounter (Signed)
He needs to have INR checked at least once a month, and last one was 7 months ago. He will need to have it done for me to refill his coumadin. Please ask him to come in to get it checked in our office or go to [facility] today

## 2016-10-09 NOTE — Telephone Encounter (Signed)
PT IS NEEDING REFILLS ON HIS MEDICATION. IT IS THE ONES THAT THE PHARM HAS REQUESTED. SHOULD BE 3 OF THEM. PT SAID THAT THE DR HAD TOLD HIM SHE WOULD REFILL AS LONG AS HE WAS SEEING THE CARDIOLOGIST AND HE IS STILL SEEING HIM. PT MADE AN APPT TO SEE YOU ON JAN 15TH AT 11PM.

## 2016-10-09 NOTE — Telephone Encounter (Addendum)
Patient requesting refill on Metoprolol, Diltiazem and Furosemide to [facility].

## 2016-10-09 NOTE — Telephone Encounter (Signed)
Tashia notify patient by phone and he called back to cancel appointment with Dr. Ancil Boozer.

## 2016-10-13 ENCOUNTER — Ambulatory Visit: Payer: 59 | Admitting: Family Medicine

## 2016-10-14 ENCOUNTER — Telehealth: Payer: Self-pay | Admitting: Family Medicine

## 2016-10-14 NOTE — Telephone Encounter (Signed)
Tiffany called pt, per Dr Ancil Boozer pt needs to call cardiologist since Dr Benjie Karvonen is managing his coumadin.

## 2016-10-14 NOTE — Telephone Encounter (Signed)
PT IS ASKING TO HAVE A STANDING ORDER TO GO TO [facility] TO HAVE HIS PT/INR CHECKED. IT DOES NOT COST HIM ANYTHING THERE. IT IS DUE TO BE DONE NOW. SAYS THAT CARDIOLOGIST DID IT LAST MONTH AT CARDIOLOGIST.

## 2016-10-14 NOTE — Telephone Encounter (Signed)
He is seeing cardiologist to manage his coumadin, they have to check it and prescribe the medication if that is how he is getting filled.

## 2016-11-03 DIAGNOSIS — I340 Nonrheumatic mitral (valve) insufficiency: Secondary | ICD-10-CM | POA: Insufficient documentation

## 2018-02-24 DIAGNOSIS — Z7901 Long term (current) use of anticoagulants: Secondary | ICD-10-CM | POA: Diagnosis not present

## 2018-04-07 DIAGNOSIS — Z7901 Long term (current) use of anticoagulants: Secondary | ICD-10-CM | POA: Diagnosis not present

## 2018-04-23 IMAGING — DX DG CHEST 1V PORT
1 series · 1 of 1 positions shown · non-contrast
Comparison: None.

CLINICAL DATA: Shortness breath for 2 days.  Tachycardia.

EXAM:
PORTABLE CHEST 1 VIEW

[chest ap]
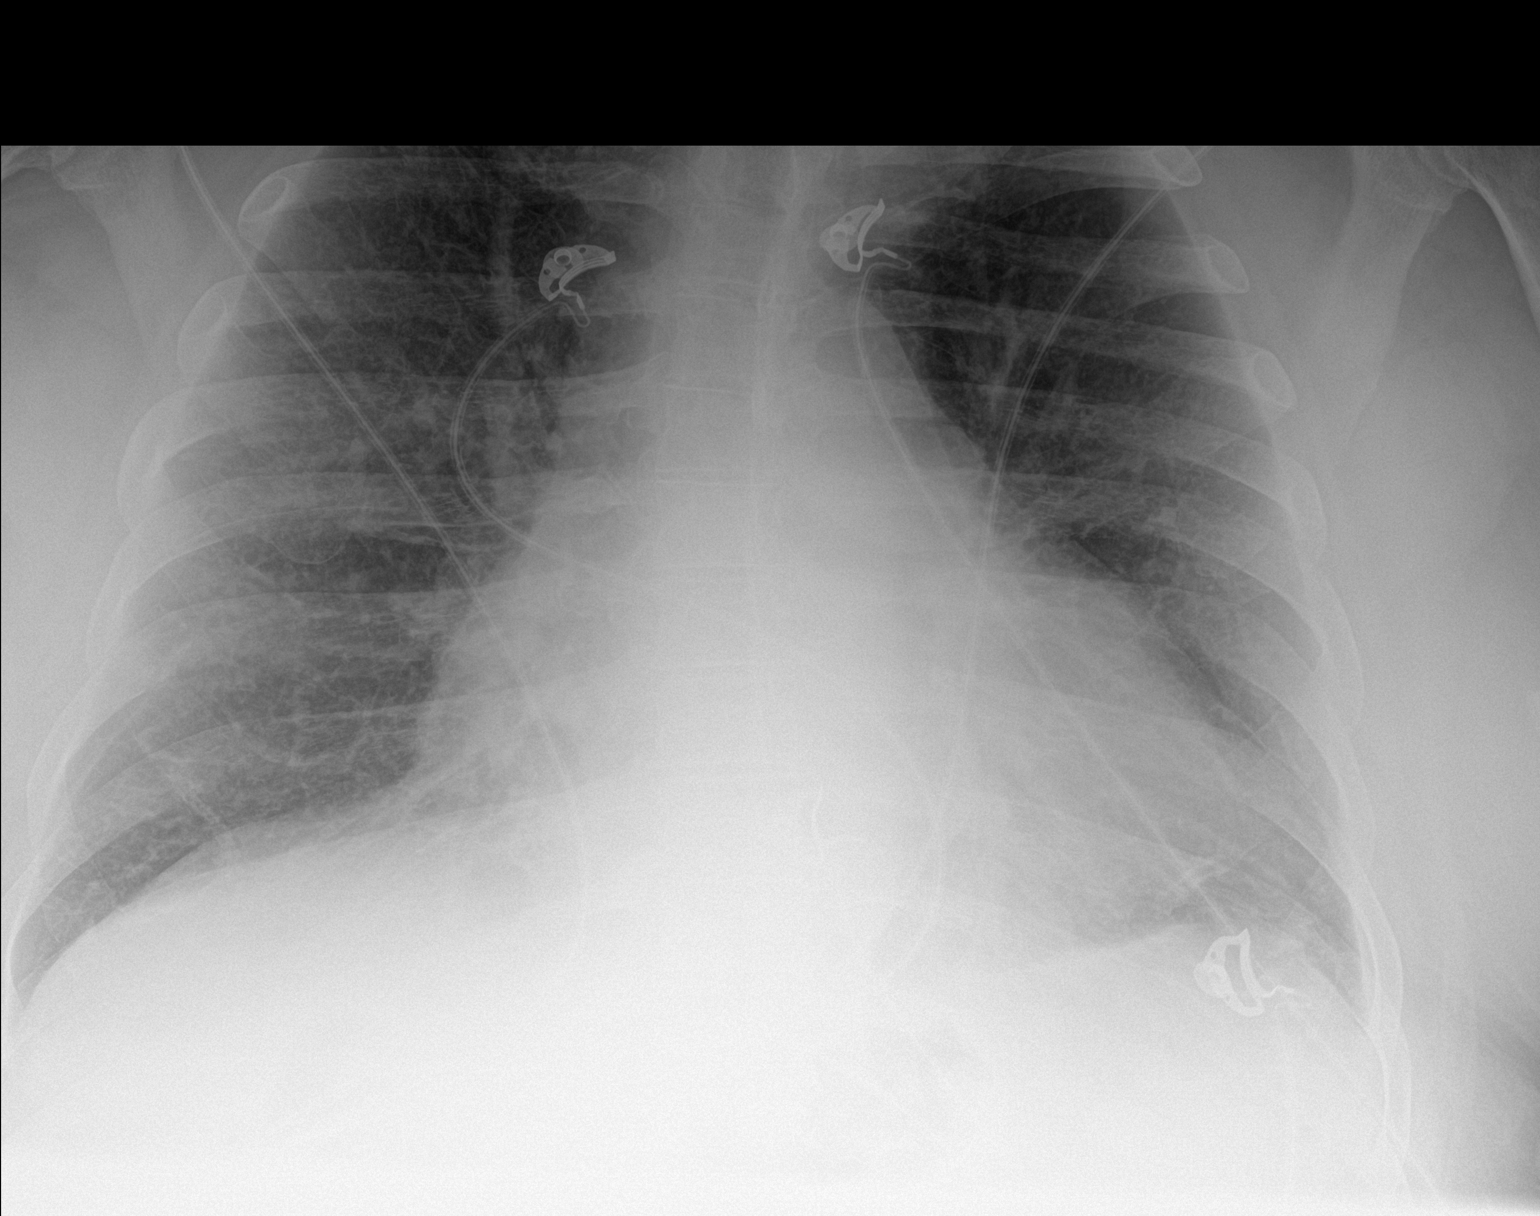

[1 of 1 positions shown; findings below may reference images not displayed]

FINDINGS: Portable AP view of the chest was obtained. The cardiac silhouette
is mildly enlarged but may be related to the AP projection.
Interstitial lung markings are mildly prominent. There is no focal
airspace disease. Trachea is midline. Bone structures appear to be
intact. Negative for a pneumothorax.
IMPRESSION: Heart size is mildly prominent and there are prominent interstitial
lung markings. Cannot exclude mild interstitial pulmonary edema.

## 2018-06-01 DIAGNOSIS — Z7901 Long term (current) use of anticoagulants: Secondary | ICD-10-CM | POA: Diagnosis not present

## 2018-08-02 DIAGNOSIS — Z7901 Long term (current) use of anticoagulants: Secondary | ICD-10-CM | POA: Diagnosis not present

## 2018-11-08 DIAGNOSIS — Z7901 Long term (current) use of anticoagulants: Secondary | ICD-10-CM | POA: Diagnosis not present

## 2019-02-22 DIAGNOSIS — Z7901 Long term (current) use of anticoagulants: Secondary | ICD-10-CM | POA: Diagnosis not present

## 2019-03-24 DIAGNOSIS — Z7901 Long term (current) use of anticoagulants: Secondary | ICD-10-CM | POA: Diagnosis not present

## 2019-04-25 DIAGNOSIS — Z7901 Long term (current) use of anticoagulants: Secondary | ICD-10-CM | POA: Diagnosis not present

## 2019-05-12 DIAGNOSIS — R002 Palpitations: Secondary | ICD-10-CM | POA: Diagnosis not present

## 2019-05-12 DIAGNOSIS — I480 Paroxysmal atrial fibrillation: Secondary | ICD-10-CM | POA: Diagnosis not present

## 2019-05-12 DIAGNOSIS — I482 Chronic atrial fibrillation, unspecified: Secondary | ICD-10-CM | POA: Diagnosis not present

## 2019-05-12 DIAGNOSIS — I5022 Chronic systolic (congestive) heart failure: Secondary | ICD-10-CM | POA: Diagnosis not present

## 2019-05-12 DIAGNOSIS — I10 Essential (primary) hypertension: Secondary | ICD-10-CM | POA: Diagnosis not present

## 2019-05-23 DIAGNOSIS — R002 Palpitations: Secondary | ICD-10-CM | POA: Diagnosis not present

## 2019-05-23 DIAGNOSIS — I482 Chronic atrial fibrillation, unspecified: Secondary | ICD-10-CM | POA: Diagnosis not present

## 2019-05-26 DIAGNOSIS — I5022 Chronic systolic (congestive) heart failure: Secondary | ICD-10-CM | POA: Diagnosis not present

## 2019-05-26 DIAGNOSIS — I482 Chronic atrial fibrillation, unspecified: Secondary | ICD-10-CM | POA: Diagnosis not present

## 2019-05-26 DIAGNOSIS — I10 Essential (primary) hypertension: Secondary | ICD-10-CM | POA: Diagnosis not present

## 2019-09-05 DIAGNOSIS — Z7901 Long term (current) use of anticoagulants: Secondary | ICD-10-CM | POA: Diagnosis not present

## 2019-09-12 ENCOUNTER — Encounter: Payer: Self-pay | Admitting: Family Medicine

## 2019-09-15 ENCOUNTER — Encounter: Payer: Self-pay | Admitting: Family Medicine

## 2019-09-15 ENCOUNTER — Ambulatory Visit: Payer: 59 | Admitting: Family Medicine

## 2019-09-15 ENCOUNTER — Other Ambulatory Visit: Payer: Self-pay

## 2019-09-15 VITALS — BP 100/66 | HR 89 | Temp 97.1°F | Resp 16 | Ht 72.0 in | Wt 234.9 lb

## 2019-09-15 DIAGNOSIS — I482 Chronic atrial fibrillation, unspecified: Secondary | ICD-10-CM | POA: Diagnosis not present

## 2019-09-15 DIAGNOSIS — E559 Vitamin D deficiency, unspecified: Secondary | ICD-10-CM

## 2019-09-15 DIAGNOSIS — Z23 Encounter for immunization: Secondary | ICD-10-CM | POA: Diagnosis not present

## 2019-09-15 DIAGNOSIS — E785 Hyperlipidemia, unspecified: Secondary | ICD-10-CM

## 2019-09-15 DIAGNOSIS — F338 Other recurrent depressive disorders: Secondary | ICD-10-CM

## 2019-09-15 DIAGNOSIS — M25562 Pain in left knee: Secondary | ICD-10-CM

## 2019-09-15 DIAGNOSIS — D692 Other nonthrombocytopenic purpura: Secondary | ICD-10-CM | POA: Diagnosis not present

## 2019-09-15 DIAGNOSIS — E538 Deficiency of other specified B group vitamins: Secondary | ICD-10-CM

## 2019-09-15 MED ORDER — DICLOFENAC SODIUM 1 % EX GEL: 2.0000 g | Freq: Four times a day (QID) | CUTANEOUS | 0 refills | Status: DC

## 2019-09-15 NOTE — Progress Notes (Signed)
Name: James Newman   MRN: [id number]    DOB: 10-31-1950   Date:09/15/2019       Progress Note  Subjective  Chief Complaint  Chief Complaint  Patient presents with  . Knee Pain    Left knee discomfort. Feels tightness and stiffness. Knee was worse over the weekend. Thinks that it is more inflammed than it is pain. Denies swelling. Gradually improving.    HPI  Knee pain: acute episode that happened last week, he states he was at work one week ago and was standing for a long time in front of a machine, he felt like twisted to the side, felt discomfort and stiffness on left knee , still present but not as severe . He got an over the counter brace, applied topical lidocaine and is doing better now   CHF/chronic afib: under the care of Dr. Scarlette Ar, had echo 04/2019, he also had stress test but I cannot see the results- patient states it was normal . He denies SOB, palpitation. 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   Obesity: he states when he went to Dr. Scarlette Ar he was feeling tired and weight was 264 lbs, so he had stress test, echo and since everything was normal he decided to resume a low carbohydrate diet. He has lost a lot of weight since than   Patient Active Problem List   Diagnosis Date Noted  . Moderate mitral insufficiency 11/03/2016  . Chronic systolic (congestive) heart failure ([place]) 03/20/2016  . Seasonal affective disorder ([place]) 11/22/2015  . 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 Encounter for therapeutic drug level monitoring 03/14/2015  .  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 B12 deficiency 03/14/2015  . Dyslipidemia 03/14/2015  . 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 Obesity (BMI 30-39.9) 03/14/2015  . Vitamin D deficiency 03/14/2015  . Chronic a-fib ([place]) 03/08/2007    Past Surgical History:  Procedure Laterality Date  . NASAL SEPTUM SURGERY      Family History  Problem Relation Age of Onset  . COPD Mother   . Hypertension Son   . Obesity Son     Social History   Socioeconomic History  . Marital status: Married    Spouse name: Not on file  . Number of children: Not on file  . Years of education: Not on file  . Highest education level: Not on file  Occupational History  . Not on file  Tobacco Use  . Smoking status: Never Smoker  . Smokeless tobacco: Never Used  Substance and Sexual Activity  . Alcohol use: Yes    Alcohol/week: 2.0 standard drinks    Types: 2 Standard drinks or equivalent per week  . Drug use: No  . Sexual activity: Not Currently    Partners: Female  Other Topics Concern  . Not on file  Social History Narrative  . Not on file   Social Determinants of Health   Financial Resource Strain: Low Risk   . Difficulty of Paying Living Expenses: Not hard at all  Food Insecurity: No  Food Insecurity  . Worried About Charity fundraiser in the Last Year: Never true  . Ran Out of Food in the Last Year: Never true  Transportation Needs: No Transportation Needs  . Lack of Transportation (Medical): No  . Lack of Transportation (Non-Medical): No  Physical Activity: Insufficiently Active  . Days of Exercise per Week: 1 day  . Minutes of Exercise per Session: 30 min  Stress: No Stress Concern Present  . Feeling of Stress : Not at all  Social Connections: Not Isolated  . Frequency of Communication with Friends and Family: More than three times a week  . Frequency of Social Gatherings with Friends and Family: More than three  times a week  . Attends Religious Services: More than 4 times per year  . Active Member of Clubs or Organizations: Yes  . Attends Archivist Meetings: More than 4 times per year  . Marital Status: Married  Human resources officer Violence: Not At Risk  . Fear of Current or Ex-Partner: No  . Emotionally Abused: No  . Physically Abused: No  . Sexually Abused: No     Current Outpatient Medications:  .  cholecalciferol (VITAMIN D) 1000 units tablet, Take 1,000 Units by mouth daily., Disp: , Rfl:  .  diltiazem (CARDIZEM CD) 240 MG 24 hr capsule, Take 1 capsule (240 mg total) by mouth daily., Disp: 30 capsule, Rfl: 5 .  furosemide (LASIX) 40 MG tablet, Take 1 tablet (40 mg total) by mouth daily., Disp: 30 tablet, Rfl: 5 .  lisinopril (PRINIVIL,ZESTRIL) 5 MG tablet, Take 1 tablet (5 mg total) by mouth daily., Disp: 30 tablet, Rfl: 5 .  metoprolol succinate (TOPROL-XL) 50 MG 24 hr tablet, Take 1 tablet (50 mg total) by mouth daily. Take with or immediately following a meal., Disp: 30 tablet, Rfl: 5 .  Testosterone (ANDROGEL PUMP) 12.5 MG/ACT (1%) GEL, Place 50 mg onto the skin daily., Disp: 2 Bottle, Rfl: 2 .  vitamin B-12 (CYANOCOBALAMIN) 1000 MCG tablet, Take 1,000 mcg by mouth daily., Disp: , Rfl:  .  warfarin (COUMADIN) 5 MG tablet, Take 1 tablet (5 mg total) by mouth daily. Take one tablet by mouth daily, Disp: 45 tablet, Rfl: 5 .  diclofenac Sodium (VOLTAREN) 1 % GEL, Apply 2 g topically 4 (four) times daily., Disp: 100 g, Rfl: 0  Allergies  Allergen Reactions  . Diltiazem Cough  . Lisinopril Cough    I personally reviewed active problem list, medication list, allergies, family history, social history, health maintenance with the patient/caregiver today.   ROS  Constitutional: Negative for fever , positive  weight change.  Respiratory: Negative for cough and shortness of breath.   Cardiovascular: Negative for chest pain or palpitations.  Gastrointestinal: Negative for abdominal  pain, no bowel changes.  Musculoskeletal: Negative for gait problem or joint swelling.  Skin: Negative for rash.  Neurological: Negative for dizziness or headache.  No other specific complaints in a complete review of systems (except as listed in HPI above).  Objective  Vitals:   09/15/19 1116  BP: 100/66  Pulse: 89  Resp: 16  Temp: (!) 97.1 F (36.2 C)  TempSrc: Temporal  SpO2: 97%  Weight: 234 lb 14.4 oz (106.5 kg)  Height: 6' (1.829 m)    Body mass index is 31.86 kg/m.  Physical Exam  Constitutional: Patient appears well-developed and well-nourished. Obese  No distress.  HEENT: head atraumatic, normocephalic, pupils equal and reactive to light Cardiovascular: Normal rate, irregular rhythm and normal heart  sounds.  No murmur heard. No BLE edema. Pulmonary/Chest: Effort normal and breath sounds normal. No respiratory distress. Abdominal: Soft.  There is no tenderness. Muscular Skeletal: normal knee exam Psychiatric: Patient has a normal mood and affect. behavior is normal. Judgment and thought content normal.  PHQ2/9: Depression screen [facility] 2/9 09/15/2019 03/12/2016 02/11/2016 11/22/2015 08/29/2015  Decreased Interest 0 0 0 0 0  Down, Depressed, Hopeless 0 0 0 0 0  PHQ - 2 Score 0 0 0 0 0  Altered sleeping 0 - - - -  Tired, decreased energy 0 - - - -  Change in appetite 0 - - - -  Feeling bad or failure about yourself  0 - - - -  Trouble concentrating 0 - - - -  Moving slowly or fidgety/restless 0 - - - -  Suicidal thoughts 0 - - - -  PHQ-9 Score 0 - - - -    phq 9 is negative   Fall Risk: Fall Risk  09/15/2019 03/12/2016 02/11/2016 11/22/2015 08/29/2015  Falls in the past year? 0 No No No No  Number falls in past yr: 0 - - - -  Injury with Fall? 0 - - - -     Functional Status Survey: Is the patient deaf or have difficulty hearing?: No Does the patient have difficulty seeing, even when wearing glasses/contacts?: No Does the patient have difficulty  concentrating, remembering, or making decisions?: No Does the patient have difficulty walking or climbing stairs?: Yes Does the patient have difficulty dressing or bathing?: No Does the patient have difficulty doing errands alone such as visiting a doctor's office or shopping?: No   Assessment & Plan  1. Need for vaccination with 13-polyvalent pneumococcal conjugate vaccine  - Pneumococcal conjugate vaccine 13-valent IM  2. Chronic a-fib (HCC)  On coumadin, managed by cardiologist   3. Purpura senilis (HCC)   Both arms  4. Seasonal affective disorder ([place])  He is doing well at this time  5. Dyslipidemia  Recheck labs during his CPE  6. B12 deficiency  Taking otc supplementation   7. Vitamin D deficiency  Continue otc supplementation   8. Acute pain of left knee  Avoid oral nsaid's since on coumadin, doing better  - diclofenac Sodium (VOLTAREN) 1 % GEL; Apply 2 g topically 4 (four) times daily.  Dispense: 100 g; Refill: 0

## 2019-10-10 DIAGNOSIS — Z7901 Long term (current) use of anticoagulants: Secondary | ICD-10-CM | POA: Diagnosis not present

## 2019-11-04 ENCOUNTER — Encounter: Payer: Self-pay | Admitting: Family Medicine

## 2019-11-04 ENCOUNTER — Other Ambulatory Visit: Payer: Self-pay | Admitting: Family Medicine

## 2019-11-04 DIAGNOSIS — Z1159 Encounter for screening for other viral diseases: Secondary | ICD-10-CM

## 2019-11-04 DIAGNOSIS — E785 Hyperlipidemia, unspecified: Secondary | ICD-10-CM

## 2019-11-04 DIAGNOSIS — E538 Deficiency of other specified B group vitamins: Secondary | ICD-10-CM

## 2019-11-04 DIAGNOSIS — Z79899 Other long term (current) drug therapy: Secondary | ICD-10-CM

## 2019-11-04 DIAGNOSIS — I482 Chronic atrial fibrillation, unspecified: Secondary | ICD-10-CM

## 2019-11-04 DIAGNOSIS — E559 Vitamin D deficiency, unspecified: Secondary | ICD-10-CM

## 2019-11-04 DIAGNOSIS — Z131 Encounter for screening for diabetes mellitus: Secondary | ICD-10-CM

## 2019-11-07 ENCOUNTER — Other Ambulatory Visit: Payer: Self-pay | Admitting: Family Medicine

## 2019-11-07 DIAGNOSIS — E785 Hyperlipidemia, unspecified: Secondary | ICD-10-CM | POA: Diagnosis not present

## 2019-11-07 DIAGNOSIS — E538 Deficiency of other specified B group vitamins: Secondary | ICD-10-CM | POA: Diagnosis not present

## 2019-11-07 DIAGNOSIS — E559 Vitamin D deficiency, unspecified: Secondary | ICD-10-CM | POA: Diagnosis not present

## 2019-11-07 DIAGNOSIS — Z1159 Encounter for screening for other viral diseases: Secondary | ICD-10-CM | POA: Diagnosis not present

## 2019-11-07 DIAGNOSIS — Z79899 Other long term (current) drug therapy: Secondary | ICD-10-CM | POA: Diagnosis not present

## 2019-11-07 DIAGNOSIS — Z131 Encounter for screening for diabetes mellitus: Secondary | ICD-10-CM | POA: Diagnosis not present

## 2019-11-08 LAB — COMPREHENSIVE METABOLIC PANEL
ALT: 22 IU/L (ref 0–44)
AST: 22 IU/L (ref 0–40)
Albumin/Globulin Ratio: 1.3 (ref 1.2–2.2)
Albumin: 4.4 g/dL (ref 3.8–4.8)
Alkaline Phosphatase: 77 IU/L (ref 39–117)
BUN/Creatinine Ratio: 20 (ref 10–24)
BUN: 20 mg/dL (ref 8–27)
Bilirubin Total: 0.8 mg/dL (ref 0.0–1.2)
CO2: 21 mmol/L (ref 20–29)
Calcium: 10 mg/dL (ref 8.6–10.2)
Chloride: 99 mmol/L (ref 96–106)
Creatinine, Ser: 1.01 mg/dL (ref 0.76–1.27)
GFR calc Af Amer: 89 mL/min/{1.73_m2} (ref >59)
GFR calc non Af Amer: 77 mL/min/{1.73_m2} (ref >59)
Globulin, Total: 3.4 g/dL (ref 1.5–4.5)
Glucose: 83 mg/dL (ref 65–99)
Potassium: 4.8 mmol/L (ref 3.5–5.2)
Sodium: 140 mmol/L (ref 134–144)
Total Protein: 7.8 g/dL (ref 6.0–8.5)

## 2019-11-08 LAB — CBC WITH DIFFERENTIAL/PLATELET
Basophils Absolute: 0.1 10*3/uL (ref 0.0–0.2)
Basos: 1 %
EOS (ABSOLUTE): 0.2 10*3/uL (ref 0.0–0.4)
Eos: 3 %
Hematocrit: 50.8 % (ref 37.5–51.0)
Hemoglobin: 17.3 g/dL (ref 13.0–17.7)
Immature Grans (Abs): 0 10*3/uL (ref 0.0–0.1)
Immature Granulocytes: 0 %
Lymphocytes Absolute: 1.7 10*3/uL (ref 0.7–3.1)
Lymphs: 22 %
MCH: 30.3 pg (ref 26.6–33.0)
MCHC: 34.1 g/dL (ref 31.5–35.7)
MCV: 89 fL (ref 79–97)
Monocytes Absolute: 0.7 10*3/uL (ref 0.1–0.9)
Monocytes: 8 %
Neutrophils Absolute: 5.2 10*3/uL (ref 1.4–7.0)
Neutrophils: 66 %
Platelets: 189 10*3/uL (ref 150–450)
RBC: 5.71 x10E6/uL (ref 4.14–5.80)
RDW: 12.9 % (ref 11.6–15.4)
WBC: 8 10*3/uL (ref 3.4–10.8)

## 2019-11-08 LAB — HEMOGLOBIN A1C
Est. average glucose Bld gHb Est-mCnc: 105 mg/dL
Hgb A1c MFr Bld: 5.3 % (ref 4.8–5.6)

## 2019-11-08 LAB — HEPATITIS C ANTIBODY: Hep C Virus Ab: <0.1 s/co ratio (ref 0.0–0.9)

## 2019-11-08 LAB — VITAMIN B12: Vitamin B-12: 1773 pg/mL — ABNORMAL HIGH (ref 232–1245)

## 2019-11-08 LAB — VITAMIN D 25 HYDROXY (VIT D DEFICIENCY, FRACTURES): Vit D, 25-Hydroxy: 37.3 ng/mL (ref 30.0–100.0)

## 2019-11-10 ENCOUNTER — Other Ambulatory Visit: Payer: Self-pay

## 2019-11-10 ENCOUNTER — Ambulatory Visit (INDEPENDENT_AMBULATORY_CARE_PROVIDER_SITE_OTHER): Payer: BC Managed Care – PPO | Admitting: Family Medicine

## 2019-11-10 ENCOUNTER — Encounter: Payer: Self-pay | Admitting: Family Medicine

## 2019-11-10 VITALS — BP 122/78 | HR 96 | Temp 97.8°F | Resp 18 | Ht 72.0 in | Wt 220.2 lb

## 2019-11-10 DIAGNOSIS — Z0000 Encounter for general adult medical examination without abnormal findings: Secondary | ICD-10-CM

## 2019-11-10 DIAGNOSIS — E785 Hyperlipidemia, unspecified: Secondary | ICD-10-CM | POA: Diagnosis not present

## 2019-11-10 NOTE — Progress Notes (Signed)
Name: James Newman   MRN: [id number]    DOB: Oct 27, 1950   Date:11/10/2019       Progress Note  Subjective  Chief Complaint  Chief Complaint  Patient presents with  . Annual Exam    HPI  Patient presents for annual CPE   USPSTF grade A and B recommendations:  Diet: he has been on keto diet since 06/2019, lost 44 lbs since last visit with Dr. Erin Fulling 04/2019 Exercise: he walks at the plant about 6-7 k steps daily but discussed importance of 150 minutes of physical activity for his heatlh   Depression: phq 9 is negative Depression screen [facility] 2/9 11/10/2019 09/15/2019 03/12/2016 02/11/2016 11/22/2015  Decreased Interest 0 0 0 0 0  Down, Depressed, Hopeless 0 0 0 0 0  PHQ - 2 Score 0 0 0 0 0  Altered sleeping 0 0 - - -  Tired, decreased energy 0 0 - - -  Change in appetite 0 0 - - -  Feeling bad or failure about yourself  0 0 - - -  Trouble concentrating 0 0 - - -  Moving slowly or fidgety/restless 0 0 - - -  Suicidal thoughts 0 0 - - -  PHQ-9 Score 0 0 - - -  Difficult doing work/chores Not difficult at all - - - -    Hypertension:  BP Readings from Last 3 Encounters:  11/10/19 122/78  09/15/19 100/66  03/12/16 102/60    Obesity: Wt Readings from Last 3 Encounters:  11/10/19 220 lb 3.2 oz (99.9 kg)  09/15/19 234 lb 14.4 oz (106.5 kg)  03/12/16 235 lb 14.4 oz (107 kg)   BMI Readings from Last 3 Encounters:  11/10/19 29.86 kg/m  09/15/19 31.86 kg/m  03/12/16 31.99 kg/m     Lipids:  Lab Results  Component Value Date   CHOL 139 02/12/2016   CHOL 187 11/22/2015   CHOL 206 (H) 08/29/2015   Lab Results  Component Value Date   HDL 30 (L) 02/12/2016   HDL 40 11/22/2015   HDL 52 08/29/2015   Lab Results  Component Value Date   LDLCALC 97 02/12/2016   LDLCALC 129 (H) 11/22/2015   LDLCALC 133 (H) 08/29/2015   Lab Results  Component Value Date   TRIG 62 02/12/2016   TRIG 90 11/22/2015   TRIG 107 08/29/2015   Lab Results  Component Value Date   CHOLHDL  4.6 02/12/2016   CHOLHDL 4.7 11/22/2015   CHOLHDL 4.0 08/29/2015   No results found for: LDLDIRECT Glucose:  Glucose  Date Value Ref Range Status  11/07/2019 83 65 - 99 mg/dL Final   Glucose, Bld  Date Value Ref Range Status  02/13/2016 108 (H) 65 - 99 mg/dL Final  02/12/2016 116 (H) 65 - 99 mg/dL Final  02/11/2016 108 (H) 65 - 99 mg/dL Final      [facility] Visit from 11/10/2019 in [facility]  AUDIT-C Score  1      Married STD testing and prevention (HIV/chl/gon/syphilis): not interested  Hep C: 10/2019  Skin cancer: Discussed monitoring for atypical lesions Colorectal cancer: repeat in 2023  Prostate cancer: no family history , discussed USPTF   IPSS Questionnaire (AUA-7): Over the past month.   1)  How often have you had a sensation of not emptying your bladder completely after you finish urinating?  0 - Not at all  2)  How often have you had to urinate again less than two hours after you finished urinating?  1 - Less than 1 time in 5  3)  How often have you found you stopped and started again several times when you urinated?  0 - Not at all  4) How difficult have you found it to postpone urination?  2 - Less than half the time  5) How often have you had a weak urinary stream?  0 - Not at all  6) How often have you had to push or strain to begin urination?  0 - Not at all  7) How many times did you most typically get up to urinate from the time you went to bed until the time you got up in the morning?  0 - None  Total score:  0-7 mildly symptomatic   8-19 moderately symptomatic   20-35 severely symptomatic    Lung cancer:   Low Dose CT Chest recommended if Age 58-80 years, 30 pack-year currently smoking OR have quit w/in 15years. Patient does not qualify.   AAA:  The USPSTF recommends one-time screening with ultrasonography in men ages 74 to 63 years who have ever smoked ECG:  2017  Advanced Care Planning: A voluntary discussion about advance care  planning including the explanation and discussion of advance directives.  Discussed health care proxy and Living will, and the patient was able to identify a health care proxy as wife   Patient does not have a living will at present time.  Patient Active Problem List   Diagnosis Date Noted  . Moderate mitral insufficiency 11/03/2016  . Chronic systolic (congestive) heart failure ([place]) 03/20/2016  . Seasonal affective disorder ([place]) 11/22/2015  . 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 Encounter for therapeutic drug level monitoring 03/14/2015  . 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 B12 deficiency 03/14/2015  . Dyslipidemia 03/14/2015  . 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 Obesity (BMI 30-39.9) 03/14/2015  . Vitamin D deficiency 03/14/2015  . Chronic a-fib ([place]) 03/08/2007    Past Surgical History:  Procedure Laterality Date  . NASAL SEPTUM SURGERY      Family History  Problem Relation Age of Onset  . COPD Mother   . Hypertension Son   . Obesity Son     Social History   Socioeconomic History  . Marital status: Married    Spouse name: Thayer Headings  . Number of children: 1  . Years of education: Not on file  . Highest education level: Not on file  Occupational History  . Not on file  Tobacco Use  . Smoking status: Never Smoker  . Smokeless tobacco: Never Used  Substance and Sexual Activity  . Alcohol use: Yes    Alcohol/week: 2.0 standard drinks    Types: 2 Standard drinks or equivalent per week  . Drug use: No  . Sexual activity: Not Currently    Partners: Female  Other Topics Concern  . Not on file  Social History Narrative  . Not on file   Social Determinants of Health   Financial Resource Strain: Low Risk   . Difficulty of Paying Living Expenses: Not hard at all  Food Insecurity: No Food Insecurity  . Worried About Charity fundraiser in the Last Year: Never true  . Ran Out of Food in the Last  Year: Never true  Transportation Needs: No Transportation Needs  . Lack of Transportation (Medical): No  . Lack of Transportation (Non-Medical): No  Physical Activity: Insufficiently Active  . Days of Exercise per  Week: 1 day  . Minutes of Exercise per Session: 30 min  Stress: No Stress Concern Present  . Feeling of Stress : Not at all  Social Connections: Not Isolated  . Frequency of Communication with Friends and Family: More than three times a week  . Frequency of Social Gatherings with Friends and Family: More than three times a week  . Attends Religious Services: More than 4 times per year  . Active Member of Clubs or Organizations: Yes  . Attends Archivist Meetings: More than 4 times per year  . Marital Status: Married  Human resources officer Violence: Not At Risk  . Fear of Current or Ex-Partner: No  . Emotionally Abused: No  . Physically Abused: No  . Sexually Abused: No     Current Outpatient Medications:  .  cholecalciferol (VITAMIN D) 1000 units tablet, Take 1,000 Units by mouth daily., Disp: , Rfl:  .  diclofenac Sodium (VOLTAREN) 1 % GEL, Apply 2 g topically 4 (four) times daily., Disp: 100 g, Rfl: 0 .  diltiazem (CARDIZEM CD) 240 MG 24 hr capsule, Take 1 capsule (240 mg total) by mouth daily., Disp: 30 capsule, Rfl: 5 .  furosemide (LASIX) 40 MG tablet, Take 1 tablet (40 mg total) by mouth daily., Disp: 30 tablet, Rfl: 5 .  lisinopril (PRINIVIL,ZESTRIL) 5 MG tablet, Take 1 tablet (5 mg total) by mouth daily., Disp: 30 tablet, Rfl: 5 .  metoprolol succinate (TOPROL-XL) 50 MG 24 hr tablet, Take 1 tablet (50 mg total) by mouth daily. Take with or immediately following a meal., Disp: 30 tablet, Rfl: 5 .  Testosterone (ANDROGEL PUMP) 12.5 MG/ACT (1%) GEL, Place 50 mg onto the skin daily., Disp: 2 Bottle, Rfl: 2 .  vitamin B-12 (CYANOCOBALAMIN) 1000 MCG tablet, Take 1,000 mcg by mouth daily., Disp: , Rfl:  .  warfarin (COUMADIN) 5 MG tablet, Take 1 tablet (5 mg total) by  mouth daily. Take one tablet by mouth daily, Disp: 45 tablet, Rfl: 5  Allergies  Allergen Reactions  . Diltiazem Cough  . Lisinopril Cough     ROS  Constitutional: Negative for fever , positive for  weight change.  Respiratory: Negative for cough and shortness of breath.   Cardiovascular: Negative for chest pain or palpitations.  Gastrointestinal: Negative for abdominal pain, no bowel changes.  Musculoskeletal: Negative for gait problem or joint swelling.  Skin: Negative for rash.  Neurological: Negative for dizziness or headache.  No other specific complaints in a complete review of systems (except as listed in HPI above).  Objective  Vitals:   11/10/19 0912  BP: 122/78  Pulse: 96  Resp: 18  Temp: 97.8 F (36.6 C)  TempSrc: Temporal  SpO2: 98%  Weight: 220 lb 3.2 oz (99.9 kg)  Height: 6' (1.829 m)    Body mass index is 29.86 kg/m.  Physical Exam  Constitutional: Patient appears well-developed and well-nourished. No distress.  HENT: Head: Normocephalic and atraumatic. Ears: B TMs ok, no erythema or effusion; Nose: Not done. Mouth/Throat: not done Eyes: Conjunctivae and EOM are normal. Pupils are equal, round, and reactive to light. No scleral icterus.  Neck: Normal range of motion. Neck supple. No JVD present. No thyromegaly present.  Cardiovascular: Normal rate, regular rhythm and normal heart sounds.  No murmur heard. No BLE edema. Pulmonary/Chest: Effort normal and breath sounds normal. No respiratory distress. Abdominal: Soft. Bowel sounds are normal, no distension. There is no tenderness. no masses MALE GENITALIA: Normal descended testes bilaterally, no masses palpated,  no hernias, no lesions, no discharge RECTAL: not done  Musculoskeletal: Normal range of motion, no joint effusions. No gross deformities Neurological: he is alert and oriented to person, place, and time. No cranial nerve deficit. Coordination, balance, strength, speech and gait are normal.   Skin: Skin is warm and dry. Very dry skin, some SK  Psychiatric: Patient has a normal mood and affect. behavior is normal. Judgment and thought content normal.  Recent Results (from the past 2160 hour(s))  CBC with Differential/Platelet     Status: None   Collection Time: 11/07/19  1:02 PM  Result Value Ref Range   WBC 8.0 3.4 - 10.8 x10E3/uL   RBC 5.71 4.14 - 5.80 x10E6/uL   Hemoglobin 17.3 13.0 - 17.7 g/dL   Hematocrit 50.8 37.5 - 51.0 %   MCV 89 79 - 97 fL   MCH 30.3 26.6 - 33.0 pg   MCHC 34.1 31.5 - 35.7 g/dL   RDW 12.9 11.6 - 15.4 %   Platelets 189 150 - 450 x10E3/uL   Neutrophils 66 Not Estab. %   Lymphs 22 Not Estab. %   Monocytes 8 Not Estab. %   Eos 3 Not Estab. %   Basos 1 Not Estab. %   Neutrophils Absolute 5.2 1.4 - 7.0 x10E3/uL   Lymphocytes Absolute 1.7 0.7 - 3.1 x10E3/uL   Monocytes Absolute 0.7 0.1 - 0.9 x10E3/uL   EOS (ABSOLUTE) 0.2 0.0 - 0.4 x10E3/uL   Basophils Absolute 0.1 0.0 - 0.2 x10E3/uL   Immature Granulocytes 0 Not Estab. %   Immature Grans (Abs) 0.0 0.0 - 0.1 x10E3/uL  Hemoglobin A1c     Status: None   Collection Time: 11/07/19  1:02 PM  Result Value Ref Range   Hgb A1c MFr Bld 5.3 4.8 - 5.6 %    Comment:          Prediabetes: 5.7 - 6.4          Diabetes: >6.4          Glycemic control for adults with diabetes: <7.0    Est. average glucose Bld gHb Est-mCnc 105 mg/dL  Hepatitis C antibody     Status: None   Collection Time: 11/07/19  1:02 PM  Result Value Ref Range   Hep C Virus Ab <0.1 0.0 - 0.9 s/co ratio    Comment:                                   Negative:     < 0.8                              Indeterminate: 0.8 - 0.9                                   Positive:     > 0.9  The CDC recommends that a positive HCV antibody result  be followed up with a HCV Nucleic Acid Amplification  test [id number]).   Vitamin B12     Status: Abnormal   Collection Time: 11/07/19  1:02 PM  Result Value Ref Range   Vitamin B-12 1,773 (H) 232 - 1,245 pg/mL   VITAMIN D 25 Hydroxy (Vit-D Deficiency, Fractures)     Status: None   Collection Time: 11/07/19  1:02 PM  Result Value Ref  Range   Vit D, 25-Hydroxy 37.3 30.0 - 100.0 ng/mL    Comment: Vitamin D deficiency has been defined by the [place] practice guideline as a level of serum 25-OH vitamin D less than 20 ng/mL (1,2). The Endocrine Society went on to further define vitamin D insufficiency as a level between 21 and 29 ng/mL (2). 1. IOM (Institute of Medicine). 2010. Dietary reference    intakes for calcium and D. [place]: The    [employer]. 2. Holick MF, Binkley Newberry, Bischoff-Ferrari HA, et al.    Evaluation, treatment, and prevention of vitamin D    deficiency: an Endocrine Society clinical practice    guideline. JCEM. 2011 Jul; 96(7):1911-30.   Comprehensive metabolic panel     Status: None   Collection Time: 11/07/19  1:02 PM  Result Value Ref Range   Glucose 83 65 - 99 mg/dL   BUN 20 8 - 27 mg/dL   Creatinine, Ser 1.01 0.76 - 1.27 mg/dL   GFR calc non Af Amer 77 >59 mL/min/1.73   GFR calc Af Amer 89 >59 mL/min/1.73   BUN/Creatinine Ratio 20 10 - 24   Sodium 140 134 - 144 mmol/L   Potassium 4.8 3.5 - 5.2 mmol/L   Chloride 99 96 - 106 mmol/L   CO2 21 20 - 29 mmol/L   Calcium 10.0 8.6 - 10.2 mg/dL   Total Protein 7.8 6.0 - 8.5 g/dL   Albumin 4.4 3.8 - 4.8 g/dL   Globulin, Total 3.4 1.5 - 4.5 g/dL   Albumin/Globulin Ratio 1.3 1.2 - 2.2   Bilirubin Total 0.8 0.0 - 1.2 mg/dL   Alkaline Phosphatase 77 39 - 117 IU/L   AST 22 0 - 40 IU/L   ALT 22 0 - 44 IU/L     PHQ2/9: Depression screen [facility] 2/9 11/10/2019 09/15/2019 03/12/2016 02/11/2016 11/22/2015  Decreased Interest 0 0 0 0 0  Down, Depressed, Hopeless 0 0 0 0 0  PHQ - 2 Score 0 0 0 0 0  Altered sleeping 0 0 - - -  Tired, decreased energy 0 0 - - -  Change in appetite 0 0 - - -  Feeling bad or failure about yourself  0 0 - - -  Trouble concentrating 0 0 - - -   Moving slowly or fidgety/restless 0 0 - - -  Suicidal thoughts 0 0 - - -  PHQ-9 Score 0 0 - - -  Difficult doing work/chores Not difficult at all - - - -     Fall Risk: Fall Risk  11/10/2019 09/15/2019 03/12/2016 02/11/2016 11/22/2015  Falls in the past year? 0 0 No No No  Number falls in past yr: 0 0 - - -  Injury with Fall? 0 0 - - -  Follow up Falls evaluation completed - - - -      Functional Status Survey: Is the patient deaf or have difficulty hearing?: No Does the patient have difficulty seeing, even when wearing glasses/contacts?: No Does the patient have difficulty concentrating, remembering, or making decisions?: No Does the patient have difficulty walking or climbing stairs?: No Does the patient have difficulty dressing or bathing?: No Does the patient have difficulty doing errands alone such as visiting a doctor's office or shopping?: No    Assessment & Plan  1. Well adult exam  Reviewed labs, discussed   2. Dyslipidemia  - Lipid panel   -Prostate cancer screening and PSA options (with potential  risks and benefits of testing vs not testing) were discussed along with recent recs/guidelines. -USPSTF grade A and B recommendations reviewed with patient; age-appropriate recommendations, preventive care, screening tests, etc discussed and encouraged; healthy living encouraged; see AVS for patient education given to patient -Discussed importance of 150 minutes of physical activity weekly, eat two servings of fish weekly, eat one serving of tree nuts ( cashews, pistachios, pecans, almonds.Marland Kitchen) every other day, eat 6 servings of fruit/vegetables daily and drink plenty of water and avoid sweet beverages.

## 2019-11-10 NOTE — Patient Instructions (Signed)
Please check with insurance about shingrix vaccine  Preventive Care 3 Years and Older, Male Preventive care refers to lifestyle choices and visits with your health care provider that can promote health and wellness. This includes:  A yearly physical exam. This is also called an annual well check.  Regular dental and eye exams.  Immunizations.  Screening for certain conditions.  Healthy lifestyle choices, such as diet and exercise. What can I expect for my preventive care visit? Physical exam Your health care provider will check:  Height and weight. These may be used to calculate body mass index (BMI), which is a measurement that tells if you are at a healthy weight.  Heart rate and blood pressure.  Your skin for abnormal spots. Counseling Your health care provider may ask you questions about:  Alcohol, tobacco, and drug use.  Emotional well-being.  Home and relationship well-being.  Sexual activity.  Eating habits.  History of falls.  Memory and ability to understand (cognition).  Work and work Statistician. What immunizations do I need?  Influenza (flu) vaccine  This is recommended every year. Tetanus, diphtheria, and pertussis (Tdap) vaccine  You may need a Td booster every 10 years. Varicella (chickenpox) vaccine  You may need this vaccine if you have not already been vaccinated. Zoster (shingles) vaccine  You may need this after age 48. Pneumococcal conjugate (PCV13) vaccine  One dose is recommended after age 35. Pneumococcal polysaccharide (PPSV23) vaccine  One dose is recommended after age 26. Measles, mumps, and rubella (MMR) vaccine  You may need at least one dose of MMR if you were born in 1957 or later. You may also need a second dose. Meningococcal conjugate (MenACWY) vaccine  You may need this if you have certain conditions. Hepatitis A vaccine  You may need this if you have certain conditions or if you travel or work in places where  you may be exposed to hepatitis A. Hepatitis B vaccine  You may need this if you have certain conditions or if you travel or work in places where you may be exposed to hepatitis B. Haemophilus influenzae type b (Hib) vaccine  You may need this if you have certain conditions. You may receive vaccines as individual doses or as more than one vaccine together in one shot (combination vaccines). Talk with your health care provider about the risks and benefits of combination vaccines. What tests do I need? Blood tests  Lipid and cholesterol levels. These may be checked every 5 years, or more frequently depending on your overall health.  Hepatitis C test.  Hepatitis B test. Screening  Lung cancer screening. You may have this screening every year starting at age 34 if you have a 30-pack-year history of smoking and currently smoke or have quit within the past 15 years.  Colorectal cancer screening. All adults should have this screening starting at age 73 and continuing until age 46. Your health care provider may recommend screening at age 12 if you are at increased risk. You will have tests every 1-10 years, depending on your results and the type of screening test.  Prostate cancer screening. Recommendations will vary depending on your family history and other risks.  Diabetes screening. This is done by checking your blood sugar (glucose) after you have not eaten for a while (fasting). You may have this done every 1-3 years.  Abdominal aortic aneurysm (AAA) screening. You may need this if you are a current or former smoker.  Sexually transmitted disease (STD) testing. Follow these  instructions at home: Eating and drinking  Eat a diet that includes fresh fruits and vegetables, whole grains, lean protein, and low-fat dairy products. Limit your intake of foods with high amounts of sugar, saturated fats, and salt.  Take vitamin and mineral supplements as recommended by your health care provider.   Do not drink alcohol if your health care provider tells you not to drink.  If you drink alcohol: ? Limit how much you have to 0-2 drinks a day. ? Be aware of how much alcohol is in your drink. In the [place] one drink equals one 12 oz bottle of beer (355 mL), one 5 oz glass of wine (148 mL), or one 1 oz glass of hard liquor (44 mL). Lifestyle  Take daily care of your teeth and gums.  Stay active. Exercise for at least 30 minutes on 5 or more days each week.  Do not use any products that contain nicotine or tobacco, such as cigarettes, e-cigarettes, and chewing tobacco. If you need help quitting, ask your health care provider.  If you are sexually active, practice safe sex. Use a condom or other form of protection to prevent STIs (sexually transmitted infections).  Talk with your health care provider about taking a low-dose aspirin or statin. What's next?  Visit your health care provider once a year for a well check visit.  Ask your health care provider how often you should have your eyes and teeth checked.  Stay up to date on all vaccines. This information is not intended to replace advice given to you by your health care provider. Make sure you discuss any questions you have with your health care provider. Document Revised: 09/09/2018 Document Reviewed: 09/09/2018 Elsevier Patient Education  2020 [employer].

## 2019-11-12 LAB — LIPID PANEL W/O CHOL/HDL RATIO
Cholesterol, Total: 207 mg/dL — ABNORMAL HIGH (ref 100–199)
HDL: 50 mg/dL (ref >39)
LDL Chol Calc (NIH): 130 mg/dL — ABNORMAL HIGH (ref 0–99)
Triglycerides: 149 mg/dL (ref 0–149)
VLDL Cholesterol Cal: 27 mg/dL (ref 5–40)

## 2019-11-12 LAB — SPECIMEN STATUS REPORT

## 2019-11-18 DIAGNOSIS — Z7901 Long term (current) use of anticoagulants: Secondary | ICD-10-CM | POA: Diagnosis not present

## 2019-11-22 DIAGNOSIS — I340 Nonrheumatic mitral (valve) insufficiency: Secondary | ICD-10-CM | POA: Diagnosis not present

## 2019-11-22 DIAGNOSIS — I482 Chronic atrial fibrillation, unspecified: Secondary | ICD-10-CM | POA: Diagnosis not present

## 2019-11-22 DIAGNOSIS — I10 Essential (primary) hypertension: Secondary | ICD-10-CM | POA: Diagnosis not present

## 2019-11-22 DIAGNOSIS — I5022 Chronic systolic (congestive) heart failure: Secondary | ICD-10-CM | POA: Diagnosis not present

## 2019-12-20 DIAGNOSIS — Z7901 Long term (current) use of anticoagulants: Secondary | ICD-10-CM | POA: Diagnosis not present

## 2020-01-23 DIAGNOSIS — Z7901 Long term (current) use of anticoagulants: Secondary | ICD-10-CM | POA: Diagnosis not present

## 2020-03-05 DIAGNOSIS — Z7901 Long term (current) use of anticoagulants: Secondary | ICD-10-CM | POA: Diagnosis not present

## 2020-04-17 DIAGNOSIS — Z7901 Long term (current) use of anticoagulants: Secondary | ICD-10-CM | POA: Diagnosis not present

## 2020-05-23 DIAGNOSIS — I10 Essential (primary) hypertension: Secondary | ICD-10-CM | POA: Diagnosis not present

## 2020-05-23 DIAGNOSIS — I340 Nonrheumatic mitral (valve) insufficiency: Secondary | ICD-10-CM | POA: Diagnosis not present

## 2020-05-23 DIAGNOSIS — I5022 Chronic systolic (congestive) heart failure: Secondary | ICD-10-CM | POA: Diagnosis not present

## 2020-05-23 DIAGNOSIS — I482 Chronic atrial fibrillation, unspecified: Secondary | ICD-10-CM | POA: Diagnosis not present

## 2020-05-31 DIAGNOSIS — Z7901 Long term (current) use of anticoagulants: Secondary | ICD-10-CM | POA: Diagnosis not present

## 2020-08-03 DIAGNOSIS — Z7901 Long term (current) use of anticoagulants: Secondary | ICD-10-CM | POA: Diagnosis not present

## 2020-09-03 DIAGNOSIS — Z7901 Long term (current) use of anticoagulants: Secondary | ICD-10-CM | POA: Diagnosis not present

## 2020-10-19 DIAGNOSIS — Z7901 Long term (current) use of anticoagulants: Secondary | ICD-10-CM | POA: Diagnosis not present

## 2020-10-30 DIAGNOSIS — I5022 Chronic systolic (congestive) heart failure: Secondary | ICD-10-CM | POA: Diagnosis not present

## 2020-10-30 DIAGNOSIS — I10 Essential (primary) hypertension: Secondary | ICD-10-CM | POA: Diagnosis not present

## 2020-10-30 DIAGNOSIS — I340 Nonrheumatic mitral (valve) insufficiency: Secondary | ICD-10-CM | POA: Diagnosis not present

## 2020-10-30 DIAGNOSIS — R001 Bradycardia, unspecified: Secondary | ICD-10-CM | POA: Insufficient documentation

## 2020-10-30 DIAGNOSIS — I482 Chronic atrial fibrillation, unspecified: Secondary | ICD-10-CM | POA: Diagnosis not present

## 2020-11-12 ENCOUNTER — Encounter: Payer: Self-pay | Admitting: Family Medicine

## 2020-11-12 ENCOUNTER — Other Ambulatory Visit: Payer: Self-pay | Admitting: Family Medicine

## 2020-11-12 DIAGNOSIS — I482 Chronic atrial fibrillation, unspecified: Secondary | ICD-10-CM

## 2020-11-12 DIAGNOSIS — Z125 Encounter for screening for malignant neoplasm of prostate: Secondary | ICD-10-CM

## 2020-11-12 DIAGNOSIS — Z79899 Other long term (current) drug therapy: Secondary | ICD-10-CM

## 2020-11-12 DIAGNOSIS — Z0000 Encounter for general adult medical examination without abnormal findings: Secondary | ICD-10-CM

## 2020-11-12 DIAGNOSIS — E291 Testicular hypofunction: Secondary | ICD-10-CM | POA: Diagnosis not present

## 2020-11-12 DIAGNOSIS — E538 Deficiency of other specified B group vitamins: Secondary | ICD-10-CM

## 2020-11-12 DIAGNOSIS — E785 Hyperlipidemia, unspecified: Secondary | ICD-10-CM

## 2020-11-12 DIAGNOSIS — Z131 Encounter for screening for diabetes mellitus: Secondary | ICD-10-CM

## 2020-11-12 DIAGNOSIS — E559 Vitamin D deficiency, unspecified: Secondary | ICD-10-CM

## 2020-11-12 NOTE — Telephone Encounter (Unsigned)
Copied from [place] [phone]. Topic: General - Other >> Nov 12, 2020 11:16 AM Antonieta Iba C wrote: Reason for CRM: pt called in for assistance. Pt says that he has an apt on Friday and would like to know if he can have labs completed before his apt so that his results will be back?   Please assist pt further.

## 2020-11-13 LAB — TESTOSTERONE,FREE AND TOTAL
Testosterone, Free: 4.4 pg/mL — ABNORMAL LOW (ref 6.6–18.1)
Testosterone: 267 ng/dL (ref 264–916)

## 2020-11-14 ENCOUNTER — Other Ambulatory Visit: Payer: Self-pay

## 2020-11-14 DIAGNOSIS — Z79899 Other long term (current) drug therapy: Secondary | ICD-10-CM | POA: Diagnosis not present

## 2020-11-14 DIAGNOSIS — E785 Hyperlipidemia, unspecified: Secondary | ICD-10-CM | POA: Diagnosis not present

## 2020-11-14 DIAGNOSIS — Z0000 Encounter for general adult medical examination without abnormal findings: Secondary | ICD-10-CM

## 2020-11-14 DIAGNOSIS — Z125 Encounter for screening for malignant neoplasm of prostate: Secondary | ICD-10-CM

## 2020-11-14 DIAGNOSIS — E559 Vitamin D deficiency, unspecified: Secondary | ICD-10-CM | POA: Diagnosis not present

## 2020-11-14 DIAGNOSIS — E538 Deficiency of other specified B group vitamins: Secondary | ICD-10-CM | POA: Diagnosis not present

## 2020-11-14 DIAGNOSIS — Z131 Encounter for screening for diabetes mellitus: Secondary | ICD-10-CM | POA: Diagnosis not present

## 2020-11-14 NOTE — Progress Notes (Signed)
Name: James Newman   MRN: [id number]    DOB: 1951/02/24   Date:11/16/2020       Progress Note  Subjective  Chief Complaint  Annual Exam   HPI  Patient presents for annual CPE and follow up  Dyslipidemia: LDL is 134, HDL is good at 47, explained results below, he states he is wiling to take medications. Discussed possible side effects.  The 10-year ASCVD risk score Mikey Bussing DC Brooke Bonito., et al., 2013) is: 15.7%   Values used to calculate the score:     Age: 70 years     Sex: Male     Is Non-Hispanic African American: No     Diabetic: No     Tobacco smoker: No     Systolic Blood Pressure: 784 mmHg     Is BP treated: Yes     HDL Cholesterol: 47 mg/dL     Total Cholesterol: 197 mg/dL   Chronic afib: under the care of Dr. Nehemiah Massed, he prescribed medications for rate control and also coumadin. He denies palpitation or chest pain.   Chronic CHF: EF in 2020 was down to 35-40 %,he is active, he denies orthopnea, no swelling but takes lasix daily to control symptoms.   Senile purpura: both arms, he is on coumadin , reassurance given  Vitamin D deficiency: taking 500 mcg advised to go up to 2000 daily   B12: go down on supplementation   Hypogonadism: low levels, but he is feeling great, he is not very sexually active and does not need medication for ED  IPSS Questionnaire (AUA-7): Over the past month.   1)  How often have you had a sensation of not emptying your bladder completely after you finish urinating?  0 - Not at all  2)  How often have you had to urinate again less than two hours after you finished urinating? 0 - Not at all  3)  How often have you found you stopped and started again several times when you urinated?  0 - Not at all  4) How difficult have you found it to postpone urination?  0 - Not at all  5) How often have you had a weak urinary stream?  0 - Not at all  6) How often have you had to push or strain to begin urination?  0 - Not at all  7) How many times did you most  typically get up to urinate from the time you went to bed until the time you got up in the morning?  0 - None  Total score:  0-7 mildly symptomatic   8-19 moderately symptomatic   20-35 severely symptomatic     Diet: down on about 20 g of carbs per day, but still getting vegetables  Exercise:he walks 7-10 thousand steps per day , walks daily    Depression: phq 9 is negative Depression screen [facility] 2/9 11/16/2020 11/10/2019 09/15/2019 03/12/2016 02/11/2016  Decreased Interest 0 0 0 0 0  Down, Depressed, Hopeless 0 0 0 0 0  PHQ - 2 Score 0 0 0 0 0  Altered sleeping 0 0 0 - -  Tired, decreased energy 0 0 0 - -  Change in appetite 0 0 0 - -  Feeling bad or failure about yourself  0 0 0 - -  Trouble concentrating 0 0 0 - -  Moving slowly or fidgety/restless 0 0 0 - -  Suicidal thoughts 0 0 0 - -  PHQ-9 Score 0 0 0 - -  Difficult doing work/chores - Not difficult at all - - -    Hypertension:  BP Readings from Last 3 Encounters:  11/16/20 120/68  11/10/19 122/78  09/15/19 100/66    Obesity: Wt Readings from Last 3 Encounters:  11/16/20 208 lb 4.8 oz (94.5 kg)  11/10/19 220 lb 3.2 oz (99.9 kg)  09/15/19 234 lb 14.4 oz (106.5 kg)   BMI Readings from Last 3 Encounters:  11/16/20 28.25 kg/m  11/10/19 29.86 kg/m  09/15/19 31.86 kg/m     Lipids:  Lab Results  Component Value Date   CHOL 197 11/14/2020   CHOL 207 (H) 11/07/2019   CHOL 139 02/12/2016   Lab Results  Component Value Date   HDL 47 11/14/2020   HDL 50 11/07/2019   HDL 30 (L) 02/12/2016   Lab Results  Component Value Date   LDLCALC 134 (H) 11/14/2020   LDLCALC 130 (H) 11/07/2019   LDLCALC 97 02/12/2016   Lab Results  Component Value Date   TRIG 89 11/14/2020   TRIG 149 11/07/2019   TRIG 62 02/12/2016   Lab Results  Component Value Date   CHOLHDL 4.2 11/14/2020   CHOLHDL 4.6 02/12/2016   CHOLHDL 4.7 11/22/2015   No results found for: LDLDIRECT Glucose:  Glucose  Date Value Ref Range Status   11/14/2020 94 65 - 99 mg/dL Final  11/07/2019 83 65 - 99 mg/dL Final   Glucose, Bld  Date Value Ref Range Status  02/13/2016 108 (H) 65 - 99 mg/dL Final  02/12/2016 116 (H) 65 - 99 mg/dL Final  02/11/2016 108 (H) 65 - 99 mg/dL Final    [place] Visit from 11/10/2019 in [facility]  AUDIT-C Score 1      Married STD testing and prevention (HIV/chl/gon/syphilis): not interested  Hep C: 11/07/2019 Skin cancer: Discussed monitoring for atypical lesions Colorectal cancer: 09/23/2012 Prostate cancer: he likes checking PSA yearly    Lung cancer:  Low Dose CT Chest recommended if Age 53-80 years, 30 pack-year currently smoking OR have quit w/in 15years. Patient does not qualify.   AAA:  The USPSTF recommends one-time screening with ultrasonography in men ages 9 to 33 years who have ever smoked. He never smoked  ECG:  08/225/2021 at Dr. Nehemiah Massed   Vaccines:   Shingrix: he will return in 2 weeks to have first shingrix vaccine Pneumonia: 09/15/2019 /ordered 2nd dose at today's visit, educated and discussed with patient. Flu: 06/2020 at work   Advanced Care Planning: A voluntary discussion about advance care planning including the explanation and discussion of advance directives.  Discussed health care proxy and Living will, and the patient was able to identify a health care proxy as wife .    Patient Active Problem List   Diagnosis Date Noted  . Bradycardia 10/30/2020  . Moderate mitral insufficiency 11/03/2016  . Chronic systolic (congestive) heart failure ([place]) 03/20/2016  . 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 Purpura senilis ([place]) 11/22/2015  . Encounter for therapeutic drug level monitoring 03/14/2015  . 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 B12 deficiency 03/14/2015  . Dyslipidemia 03/14/2015  . 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 Obesity (BMI 30-39.9) 03/14/2015   . Vitamin D deficiency 03/14/2015  . Chronic a-fib ([place]) 03/08/2007    Past Surgical History:  Procedure Laterality Date  . NASAL SEPTUM SURGERY      Family History  Problem Relation Age of Onset  . COPD  Mother   . Hypertension Son   . Obesity Son     Social History   Socioeconomic History  . Marital status: Married    Spouse name: Thayer Headings  . Number of children: 1  . Years of education: Not on file  . Highest education level: Not on file  Occupational History  . Not on file  Tobacco Use  . Smoking status: Never Smoker  . Smokeless tobacco: Never Used  Vaping Use  . Vaping Use: Never used  Substance and Sexual Activity  . Alcohol use: Yes    Alcohol/week: 2.0 standard drinks    Types: 2 Standard drinks or equivalent per week  . Drug use: No  . Sexual activity: Not Currently    Partners: Female  Other Topics Concern  . Not on file  Social History Narrative  . Not on file   Social Determinants of Health   Financial Resource Strain: Medium Risk  . Difficulty of Paying Living Expenses: Somewhat hard  Food Insecurity: Food Insecurity Present  . Worried About Charity fundraiser in the Last Year: Often true  . Ran Out of Food in the Last Year: Sometimes true  Transportation Needs: No Transportation Needs  . Lack of Transportation (Medical): No  . Lack of Transportation (Non-Medical): No  Physical Activity: Insufficiently Active  . Days of Exercise per Week: 3 days  . Minutes of Exercise per Session: 30 min  Stress: No Stress Concern Present  . Feeling of Stress : Not at all  Social Connections: Socially Integrated  . Frequency of Communication with Friends and Family: More than three times a week  . Frequency of Social Gatherings with Friends and Family: Three times a week  . Attends Religious Services: More than 4 times per year  . Active Member of Clubs or Organizations: Yes  . Attends Archivist Meetings: More than 4 times per year  . Marital  Status: Married  Human resources officer Violence: Not At Risk  . Fear of Current or Ex-Partner: No  . Emotionally Abused: No  . Physically Abused: No  . Sexually Abused: No     Current Outpatient Medications:  .  cholecalciferol (VITAMIN D) 1000 units tablet, Take 1,000 Units by mouth daily., Disp: , Rfl:  .  diclofenac Sodium (VOLTAREN) 1 % GEL, Apply 2 g topically 4 (four) times daily., Disp: 100 g, Rfl: 0 .  diltiazem (CARDIZEM CD) 240 MG 24 hr capsule, Take 1 capsule (240 mg total) by mouth daily., Disp: 30 capsule, Rfl: 5 .  furosemide (LASIX) 40 MG tablet, Take 1 tablet (40 mg total) by mouth daily., Disp: 30 tablet, Rfl: 5 .  lisinopril (PRINIVIL,ZESTRIL) 5 MG tablet, Take 1 tablet (5 mg total) by mouth daily., Disp: 30 tablet, Rfl: 5 .  metoprolol succinate (TOPROL-XL) 50 MG 24 hr tablet, Take 1 tablet (50 mg total) by mouth daily. Take with or immediately following a meal., Disp: 30 tablet, Rfl: 5 .  rosuvastatin (CRESTOR) 5 MG tablet, Take 1 tablet (5 mg total) by mouth daily., Disp: 90 tablet, Rfl: 1 .  Testosterone (ANDROGEL PUMP) 12.5 MG/ACT (1%) GEL, Place 50 mg onto the skin daily., Disp: 2 Bottle, Rfl: 2 .  vitamin B-12 (CYANOCOBALAMIN) 1000 MCG tablet, Take 1,000 mcg by mouth daily., Disp: , Rfl:  .  warfarin (COUMADIN) 5 MG tablet, Take 1 tablet (5 mg total) by mouth daily. Take one tablet by mouth daily, Disp: 45 tablet, Rfl: 5 .  zinc gluconate 50 MG  tablet, Take 1 tablet by mouth daily., Disp: , Rfl:   Allergies  Allergen Reactions  . Diltiazem Cough  . Lisinopril Cough     ROS  Constitutional: Negative for fever , positive for weight change.  Respiratory: Negative for cough and shortness of breath.   Cardiovascular: Negative for chest pain or palpitations.  Gastrointestinal: Negative for abdominal pain, no bowel changes.  Musculoskeletal: Negative for gait problem or joint swelling.  Skin: Negative for rash.  Neurological: Negative for dizziness or headache.  No  other specific complaints in a complete review of systems (except as listed in HPI above).   Objective  Vitals:   11/16/20 0753  BP: 120/68  Pulse: 89  Resp: 16  Temp: 97.6 F (36.4 C)  TempSrc: Oral  SpO2: 97%  Weight: 208 lb 4.8 oz (94.5 kg)  Height: 6' (1.829 m)    Body mass index is 28.25 kg/m.  Physical Exam  Constitutional: Patient appears well-developed and well-nourished. No distress.  HENT: Head: Normocephalic and atraumatic. Ears: B TMs ok, no erythema or effusion; Nose: Not done  Mouth/Throat: not done  Eyes: Conjunctivae and EOM are normal. Pupils are equal, round, and reactive to light. No scleral icterus.  Neck: Normal range of motion. Neck supple. No JVD present. No thyromegaly present.  Cardiovascular: Normal rate, regular rhythm and normal heart sounds.  No murmur heard. No BLE edema. Varicose levels on both legs  Pulmonary/Chest: Effort normal and breath sounds normal. No respiratory distress. Abdominal: Soft. Bowel sounds are normal, no distension. There is no tenderness. no masses MALE GENITALIA: Normal descended and small  testes bilaterally, no masses palpated, no hernias, no lesions, no discharge RECTAL: Prostate not done  Musculoskeletal: Normal range of motion, no joint effusions. Cervical kyphosis  Neurological: he is alert and oriented to person, place, and time. No cranial nerve deficit. Coordination, balance, strength, speech and gait are normal.  Skin: senile purpura both arms  Psychiatric: Patient has a normal mood and affect. behavior is normal. Judgment and thought content normal.  Recent Results (from the past 2160 hour(s))  Testosterone,Free and Total     Status: Abnormal   Collection Time: 11/12/20  2:04 PM  Result Value Ref Range   Testosterone 267 264 - 916 ng/dL    Comment: Adult male reference interval is based on a population of healthy nonobese males (BMI <30) between 75 and 57 years old. [place], [place] [id number].  PMID: 35573220.    Testosterone, Free 4.4 (L) 6.6 - 18.1 pg/mL  Vitamin B12     Status: Abnormal   Collection Time: 11/14/20  8:45 AM  Result Value Ref Range   Vitamin B-12 >2000 (H) 232 - 1245 pg/mL  Comprehensive metabolic panel     Status: None   Collection Time: 11/14/20  8:45 AM  Result Value Ref Range   Glucose 94 65 - 99 mg/dL   BUN 19 8 - 27 mg/dL   Creatinine, Ser 0.85 0.76 - 1.27 mg/dL   GFR calc non Af Amer 90 >59 mL/min/1.73   GFR calc Af Amer 104 >59 mL/min/1.73    Comment: **In accordance with recommendations from the NKF-ASN Task force,**   [facility] is in the process of updating its eGFR calculation to the   2021 CKD-EPI creatinine equation that estimates kidney function   without a race variable.    BUN/Creatinine Ratio 22 10 - 24   Sodium 140 134 - 144 mmol/L   Potassium 4.1 3.5 - 5.2 mmol/L  Chloride 102 96 - 106 mmol/L   CO2 22 20 - 29 mmol/L   Calcium 9.4 8.6 - 10.2 mg/dL   Total Protein 7.3 6.0 - 8.5 g/dL   Albumin 4.3 3.8 - 4.8 g/dL   Globulin, Total 3.0 1.5 - 4.5 g/dL   Albumin/Globulin Ratio 1.4 1.2 - 2.2   Bilirubin Total 0.9 0.0 - 1.2 mg/dL   Alkaline Phosphatase 67 44 - 121 IU/L   AST 24 0 - 40 IU/L   ALT 31 0 - 44 IU/L  CBC with Differential/Platelet     Status: None   Collection Time: 11/14/20  8:45 AM  Result Value Ref Range   WBC 6.8 3.4 - 10.8 x10E3/uL   RBC 5.23 4.14 - 5.80 x10E6/uL   Hemoglobin 16.0 13.0 - 17.7 g/dL   Hematocrit 48.0 37.5 - 51.0 %   MCV 92 79 - 97 fL   MCH 30.6 26.6 - 33.0 pg   MCHC 33.3 31.5 - 35.7 g/dL   RDW 12.3 11.6 - 15.4 %   Platelets 170 150 - 450 x10E3/uL   Neutrophils 61 Not Estab. %   Lymphs 25 Not Estab. %   Monocytes 9 Not Estab. %   Eos 4 Not Estab. %   Basos 1 Not Estab. %   Neutrophils Absolute 4.2 1.4 - 7.0 x10E3/uL   Lymphocytes Absolute 1.7 0.7 - 3.1 x10E3/uL   Monocytes Absolute 0.6 0.1 - 0.9 x10E3/uL   EOS (ABSOLUTE) 0.3 0.0 - 0.4 x10E3/uL   Basophils Absolute 0.0 0.0 - 0.2 x10E3/uL    Immature Granulocytes 0 Not Estab. %   Immature Grans (Abs) 0.0 0.0 - 0.1 x10E3/uL  Lipid panel     Status: Abnormal   Collection Time: 11/14/20  8:45 AM  Result Value Ref Range   Cholesterol, Total 197 100 - 199 mg/dL   Triglycerides 89 0 - 149 mg/dL   HDL 47 >39 mg/dL   VLDL Cholesterol Cal 16 5 - 40 mg/dL   LDL Chol Calc (NIH) 134 (H) 0 - 99 mg/dL   Chol/HDL Ratio 4.2 0.0 - 5.0 ratio    Comment:                                   T. Chol/HDL Ratio                                             Men  Women                               1/2 Avg.Risk  3.4    3.3                                   Avg.Risk  5.0    4.4                                2X Avg.Risk  9.6    7.1                                3X Avg.Risk 23.4  11.0   HgB A1c     Status: None   Collection Time: 11/14/20  8:45 AM  Result Value Ref Range   Hgb A1c MFr Bld 5.2 4.8 - 5.6 %    Comment:          Prediabetes: 5.7 - 6.4          Diabetes: >6.4          Glycemic control for adults with diabetes: <7.0    Est. average glucose Bld gHb Est-mCnc 103 mg/dL  PSA     Status: None   Collection Time: 11/14/20  8:45 AM  Result Value Ref Range   Prostate Specific Ag, Serum 2.2 0.0 - 4.0 ng/mL    Comment: Roche ECLIA methodology. According to the American Urological Association, Serum PSA should decrease and remain at undetectable levels after radical prostatectomy. The AUA defines biochemical recurrence as an initial PSA value 0.2 ng/mL or greater followed by a subsequent confirmatory PSA value 0.2 ng/mL or greater. Values obtained with different assay methods or kits cannot be used interchangeably. Results cannot be interpreted as absolute evidence of the presence or absence of malignant disease.   VITAMIN D 25 Hydroxy (Vit-D Deficiency, Fractures)     Status: Abnormal   Collection Time: 11/14/20  8:45 AM  Result Value Ref Range   Vit D, 25-Hydroxy 29.2 (L) 30.0 - 100.0 ng/mL    Comment: Vitamin D deficiency has been  defined by the [place] practice guideline as a level of serum 25-OH vitamin D less than 20 ng/mL (1,2). The Endocrine Society went on to further define vitamin D insufficiency as a level between 21 and 29 ng/mL (2). 1. IOM (Institute of Medicine). 2010. Dietary reference    intakes for calcium and D. [place]: The    [employer]. 2. Holick MF, Binkley Pixley, Bischoff-Ferrari HA, et al.    Evaluation, treatment, and prevention of vitamin D    deficiency: an Endocrine Society clinical practice    guideline. JCEM. 2011 Jul; 96(7):1911-30.      Fall Risk: Fall Risk  11/16/2020 11/10/2019 09/15/2019 03/12/2016 02/11/2016  Falls in the past year? 0 0 0 No No  Number falls in past yr: 0 0 0 - -  Injury with Fall? 0 0 0 - -  Follow up - Falls evaluation completed - - -     Functional Status Survey: Is the patient deaf or have difficulty hearing?: No Does the patient have difficulty seeing, even when wearing glasses/contacts?: No Does the patient have difficulty concentrating, remembering, or making decisions?: No Does the patient have difficulty walking or climbing stairs?: No Does the patient have difficulty dressing or bathing?: No Does the patient have difficulty doing errands alone such as visiting a doctor's office or shopping?: No    Assessment & Plan  1. Chronic a-fib ([place])   2. Vitamin D deficiency   3. Chronic systolic (congestive) heart failure ([place])   4. Pure hypercholesterolemia  - rosuvastatin (CRESTOR) 5 MG tablet; Take 1 tablet (5 mg total) by mouth daily.  Dispense: 90 tablet; Refill: 1 - Lipid panel - Hepatic function panel  5. Vitamin B 12 deficiency   6. Well adult exam  He will return for shingrix, refuses COVID-19   7. Purpura senilis ([place])  On both arms, left worse than right   8. Hypogonadism in male  Reviewed results , he feels well , we will not replace it   9.  B12  deficiency  Decrease supplementation  -Prostate cancer screening and PSA options (with potential risks and benefits of testing vs not testing) were discussed along with recent recs/guidelines. -USPSTF grade A and B recommendations reviewed with patient; age-appropriate recommendations, preventive care, screening tests, etc discussed and encouraged; healthy living encouraged; see AVS for patient education given to patient -Discussed importance of 150 minutes of physical activity weekly, eat two servings of fish weekly, eat one serving of tree nuts ( cashews, pistachios, pecans, almonds.Marland Kitchen) every other day, eat 6 servings of fruit/vegetables daily and drink plenty of water and avoid sweet beverages.

## 2020-11-15 LAB — LIPID PANEL
Chol/HDL Ratio: 4.2 ratio (ref 0.0–5.0)
Cholesterol, Total: 197 mg/dL (ref 100–199)
HDL: 47 mg/dL (ref >39)
LDL Chol Calc (NIH): 134 mg/dL — ABNORMAL HIGH (ref 0–99)
Triglycerides: 89 mg/dL (ref 0–149)
VLDL Cholesterol Cal: 16 mg/dL (ref 5–40)

## 2020-11-15 LAB — HEMOGLOBIN A1C
Est. average glucose Bld gHb Est-mCnc: 103 mg/dL
Hgb A1c MFr Bld: 5.2 % (ref 4.8–5.6)

## 2020-11-15 LAB — COMPREHENSIVE METABOLIC PANEL
ALT: 31 IU/L (ref 0–44)
AST: 24 IU/L (ref 0–40)
Albumin/Globulin Ratio: 1.4 (ref 1.2–2.2)
Albumin: 4.3 g/dL (ref 3.8–4.8)
Alkaline Phosphatase: 67 IU/L (ref 44–121)
BUN/Creatinine Ratio: 22 (ref 10–24)
BUN: 19 mg/dL (ref 8–27)
Bilirubin Total: 0.9 mg/dL (ref 0.0–1.2)
CO2: 22 mmol/L (ref 20–29)
Calcium: 9.4 mg/dL (ref 8.6–10.2)
Chloride: 102 mmol/L (ref 96–106)
Creatinine, Ser: 0.85 mg/dL (ref 0.76–1.27)
GFR calc Af Amer: 104 mL/min/{1.73_m2} (ref >59)
GFR calc non Af Amer: 90 mL/min/{1.73_m2} (ref >59)
Globulin, Total: 3 g/dL (ref 1.5–4.5)
Glucose: 94 mg/dL (ref 65–99)
Potassium: 4.1 mmol/L (ref 3.5–5.2)
Sodium: 140 mmol/L (ref 134–144)
Total Protein: 7.3 g/dL (ref 6.0–8.5)

## 2020-11-15 LAB — VITAMIN B12: Vitamin B-12: >2000 pg/mL — ABNORMAL HIGH (ref 232–1245)

## 2020-11-15 LAB — VITAMIN D 25 HYDROXY (VIT D DEFICIENCY, FRACTURES): Vit D, 25-Hydroxy: 29.2 ng/mL — ABNORMAL LOW (ref 30.0–100.0)

## 2020-11-15 LAB — CBC WITH DIFFERENTIAL/PLATELET
Basophils Absolute: 0 10*3/uL (ref 0.0–0.2)
Basos: 1 %
EOS (ABSOLUTE): 0.3 10*3/uL (ref 0.0–0.4)
Eos: 4 %
Hematocrit: 48 % (ref 37.5–51.0)
Hemoglobin: 16 g/dL (ref 13.0–17.7)
Immature Grans (Abs): 0 10*3/uL (ref 0.0–0.1)
Immature Granulocytes: 0 %
Lymphocytes Absolute: 1.7 10*3/uL (ref 0.7–3.1)
Lymphs: 25 %
MCH: 30.6 pg (ref 26.6–33.0)
MCHC: 33.3 g/dL (ref 31.5–35.7)
MCV: 92 fL (ref 79–97)
Monocytes Absolute: 0.6 10*3/uL (ref 0.1–0.9)
Monocytes: 9 %
Neutrophils Absolute: 4.2 10*3/uL (ref 1.4–7.0)
Neutrophils: 61 %
Platelets: 170 10*3/uL (ref 150–450)
RBC: 5.23 x10E6/uL (ref 4.14–5.80)
RDW: 12.3 % (ref 11.6–15.4)
WBC: 6.8 10*3/uL (ref 3.4–10.8)

## 2020-11-15 LAB — PSA: Prostate Specific Ag, Serum: 2.2 ng/mL (ref 0.0–4.0)

## 2020-11-16 ENCOUNTER — Other Ambulatory Visit: Payer: Self-pay

## 2020-11-16 ENCOUNTER — Encounter: Payer: Self-pay | Admitting: Family Medicine

## 2020-11-16 ENCOUNTER — Ambulatory Visit (INDEPENDENT_AMBULATORY_CARE_PROVIDER_SITE_OTHER): Payer: BC Managed Care – PPO | Admitting: Family Medicine

## 2020-11-16 VITALS — BP 120/68 | HR 89 | Temp 97.6°F | Resp 16 | Ht 72.0 in | Wt 208.3 lb

## 2020-11-16 DIAGNOSIS — I5022 Chronic systolic (congestive) heart failure: Secondary | ICD-10-CM | POA: Diagnosis not present

## 2020-11-16 DIAGNOSIS — E559 Vitamin D deficiency, unspecified: Secondary | ICD-10-CM | POA: Diagnosis not present

## 2020-11-16 DIAGNOSIS — Z0000 Encounter for general adult medical examination without abnormal findings: Secondary | ICD-10-CM | POA: Diagnosis not present

## 2020-11-16 DIAGNOSIS — D692 Other nonthrombocytopenic purpura: Secondary | ICD-10-CM

## 2020-11-16 DIAGNOSIS — E7800 Pure hypercholesterolemia, unspecified: Secondary | ICD-10-CM | POA: Diagnosis not present

## 2020-11-16 DIAGNOSIS — I482 Chronic atrial fibrillation, unspecified: Secondary | ICD-10-CM

## 2020-11-16 DIAGNOSIS — E538 Deficiency of other specified B group vitamins: Secondary | ICD-10-CM

## 2020-11-16 DIAGNOSIS — E291 Testicular hypofunction: Secondary | ICD-10-CM

## 2020-11-16 MED ORDER — ROSUVASTATIN CALCIUM 5 MG PO TABS: 5.0000 mg | ORAL_TABLET | Freq: Every day | ORAL | 1 refills | Status: DC

## 2020-11-16 NOTE — Patient Instructions (Signed)
Preventive Care 65 Years and Older, Male Preventive care refers to lifestyle choices and visits with your health care provider that can promote health and wellness. This includes:  A yearly physical exam. This is also called an annual wellness visit.  Regular dental and eye exams.  Immunizations.  Screening for certain conditions.  Healthy lifestyle choices, such as: ? Eating a healthy diet. ? Getting regular exercise. ? Not using drugs or products that contain nicotine and tobacco. ? Limiting alcohol use. What can I expect for my preventive care visit? Physical exam Your health care provider will check your:  Height and weight. These may be used to calculate your BMI (body mass index). BMI is a measurement that tells if you are at a healthy weight.  Heart rate and blood pressure.  Body temperature.  Skin for abnormal spots. Counseling Your health care provider may ask you questions about your:  Past medical problems.  Family's medical history.  Alcohol, tobacco, and drug use.  Emotional well-being.  Home life and relationship well-being.  Sexual activity.  Diet, exercise, and sleep habits.  History of falls.  Memory and ability to understand (cognition).  Work and work environment.  Access to firearms. What immunizations do I need? Vaccines are usually given at various ages, according to a schedule. Your health care provider will recommend vaccines for you based on your age, medical history, and lifestyle or other factors, such as travel or where you work.   What tests do I need? Blood tests  Lipid and cholesterol levels. These may be checked every 5 years, or more often depending on your overall health.  Hepatitis C test.  Hepatitis B test. Screening  Lung cancer screening. You may have this screening every year starting at age 55 if you have a 30-pack-year history of smoking and currently smoke or have quit within the past 15 years.  Colorectal  cancer screening. ? All adults should have this screening starting at age 50 and continuing until age 75. ? Your health care provider may recommend screening at age 45 if you are at increased risk. ? You will have tests every 1-10 years, depending on your results and the type of screening test.  Prostate cancer screening. Recommendations will vary depending on your family history and other risks.  Genital exam to check for testicular cancer or hernias.  Diabetes screening. ? This is done by checking your blood sugar (glucose) after you have not eaten for a while (fasting). ? You may have this done every 1-3 years.  Abdominal aortic aneurysm (AAA) screening. You may need this if you are a current or former smoker.  STD (sexually transmitted disease) testing, if you are at risk. Follow these instructions at home: Eating and drinking  Eat a diet that includes fresh fruits and vegetables, whole grains, lean protein, and low-fat dairy products. Limit your intake of foods with high amounts of sugar, saturated fats, and salt.  Take vitamin and mineral supplements as recommended by your health care provider.  Do not drink alcohol if your health care provider tells you not to drink.  If you drink alcohol: ? Limit how much you have to 0-2 drinks a day. ? Be aware of how much alcohol is in your drink. In the [place] one drink equals one 12 oz bottle of beer (355 mL), one 5 oz glass of wine (148 mL), or one 1 oz glass of hard liquor (44 mL).   Lifestyle  Take daily care of your teeth   and gums. Brush your teeth every morning and night with fluoride toothpaste. Floss one time each day.  Stay active. Exercise for at least 30 minutes 5 or more days each week.  Do not use any products that contain nicotine or tobacco, such as cigarettes, e-cigarettes, and chewing tobacco. If you need help quitting, ask your health care provider.  Do not use drugs.  If you are sexually active, practice safe sex.  Use a condom or other form of protection to prevent STIs (sexually transmitted infections).  Talk with your health care provider about taking a low-dose aspirin or statin.  Find healthy ways to cope with stress, such as: ? Meditation, yoga, or listening to music. ? Journaling. ? Talking to a trusted person. ? Spending time with friends and family. Safety  Always wear your seat belt while driving or riding in a vehicle.  Do not drive: ? If you have been drinking alcohol. Do not ride with someone who has been drinking. ? When you are tired or distracted. ? While texting.  Wear a helmet and other protective equipment during sports activities.  If you have firearms in your house, make sure you follow all gun safety procedures. What's next?  Visit your health care provider once a year for an annual wellness visit.  Ask your health care provider how often you should have your eyes and teeth checked.  Stay up to date on all vaccines. This information is not intended to replace advice given to you by your health care provider. Make sure you discuss any questions you have with your health care provider. Document Revised: 06/14/2019 Document Reviewed: 09/09/2018 Elsevier Patient Education  2021 Elsevier Inc.  

## 2021-05-13 ENCOUNTER — Other Ambulatory Visit: Payer: Self-pay | Admitting: Family Medicine

## 2021-05-13 DIAGNOSIS — E7800 Pure hypercholesterolemia, unspecified: Secondary | ICD-10-CM

## 2021-05-13 NOTE — Telephone Encounter (Signed)
Requested medications are due for refill today Just filled 7/17  Requested medications are on the active medication list yes  Last refill 7/17  Last visit 10/2020  Future visit scheduled no  Notes to clinic Has just filled this but noticed pt had labs ordered, lipids and hepatic function. Liipids had just been done 2 days prior but Hepatic panel never drawn, please assess.

## 2021-05-14 NOTE — Telephone Encounter (Signed)
Last appt was 11/16/20, no upcoming appt scheduled

## 2021-05-16 ENCOUNTER — Encounter: Payer: Self-pay | Admitting: Family Medicine

## 2021-05-16 NOTE — Telephone Encounter (Signed)
Appt sch'd for January

## 2021-07-06 LAB — LIPID PANEL
Chol/HDL Ratio: 3.9 ratio (ref 0.0–5.0)
Cholesterol, Total: 153 mg/dL (ref 100–199)
HDL: 39 mg/dL — ABNORMAL LOW (ref >39)
LDL Chol Calc (NIH): 86 mg/dL (ref 0–99)
Triglycerides: 158 mg/dL — ABNORMAL HIGH (ref 0–149)
VLDL Cholesterol Cal: 28 mg/dL (ref 5–40)

## 2021-07-06 LAB — HEPATIC FUNCTION PANEL
ALT: 28 IU/L (ref 0–44)
AST: 21 IU/L (ref 0–40)
Albumin: 4.3 g/dL (ref 3.8–4.8)
Alkaline Phosphatase: 66 IU/L (ref 44–121)
Bilirubin Total: 0.7 mg/dL (ref 0.0–1.2)
Bilirubin, Direct: 0.22 mg/dL (ref 0.00–0.40)
Total Protein: 7.1 g/dL (ref 6.0–8.5)

## 2021-09-27 NOTE — Progress Notes (Signed)
Name: James Newman   MRN: [id number]    DOB: Oct 29, 1950   Date:10/07/2021       Progress Note  Subjective  Chief Complaint  Medication Refill  HPI  Dyslipidemia: last LDL was checked 10/22 and down from 134 to 89 with low dose Rosuvastatin, discussed going up on the dose to get level below 70 , he is willing to go up to 10 mg daily   Chronic afib: under the care of Dr. Nehemiah Massed, he takes  medications for rate control and also coumadin. He denies palpitation, SOB  or chest pain.    Chronic CHF: EF in 2020 was down to 35-40 %, he is active, he denies orthopnea, no swelling but takes lasix daily to control symptoms. He is compliant with follow up with cardiologist     Senile purpura: both arms, he is on coumadin , stable.    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[place]) 11/22/2015   Purpura senilis ([place]) 11/22/2015   Encounter for therapeutic drug level monitoring 03/14/2015   Atypical nevus 03/14/2015   B12 deficiency 03/14/2015   Dyslipidemia 03/14/2015   Tension headache 03/14/2015   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   Obesity (BMI 30-39.9) 03/14/2015   Vitamin D deficiency 03/14/2015   Chronic a-fib ([place]) 03/08/2007    Past Surgical History:  Procedure Laterality Date   NASAL SEPTUM SURGERY      Family History  Problem Relation Age of Onset   COPD Mother    Hypertension Son    Obesity Son     Social History   Tobacco Use   Smoking status:  Never   Smokeless tobacco: Never  Substance Use Topics   Alcohol use: Yes    Alcohol/week: 2.0 standard drinks    Types: 2 Standard drinks or equivalent per week     Current Outpatient Medications:    cholecalciferol (VITAMIN D) 1000 units tablet, Take 1,000 Units by mouth daily., Disp: , Rfl:    diclofenac Sodium (VOLTAREN) 1 % GEL, Apply 2 g topically 4 (four) times daily., Disp: 100 g, Rfl: 0   diltiazem (CARDIZEM CD) 240 MG 24 hr capsule, Take 1 capsule (240 mg total) by mouth daily., Disp: 30 capsule, Rfl: 5   furosemide (LASIX) 40 MG tablet, Take 1 tablet (40 mg total) by mouth daily., Disp: 30 tablet, Rfl: 5   lisinopril (PRINIVIL,ZESTRIL) 5 MG tablet, Take 1 tablet (5 mg total) by mouth daily., Disp: 30 tablet, Rfl: 5   metoprolol succinate (TOPROL-XL) 50 MG 24 hr tablet, Take 1 tablet (50 mg total) by mouth daily. Take with or immediately following a meal., Disp: 30 tablet, Rfl: 5   vitamin B-12 (CYANOCOBALAMIN) 1000 MCG tablet, Take 1,000 mcg by mouth daily., Disp: , Rfl:    warfarin (COUMADIN) 5 MG tablet, Take 1 tablet (5 mg total) by mouth daily. Take one tablet by mouth daily, Disp: 45 tablet, Rfl: 5  zinc gluconate 50 MG tablet, Take 1 tablet by mouth daily., Disp: , Rfl:    rosuvastatin (CRESTOR) 10 MG tablet, Take 1 tablet (10 mg total) by mouth daily., Disp: 90 tablet, Rfl: 3  Allergies  Allergen Reactions   Diltiazem Cough   Lisinopril Cough    I personally reviewed active problem list, medication list, allergies, family history, social history, health maintenance with the patient/caregiver today.   ROS  Constitutional: Negative for fever, positive for weight change.  Respiratory: Negative for cough and shortness of breath.   Cardiovascular: Negative for chest pain or palpitations.  Gastrointestinal: Negative for abdominal pain, no bowel changes.  Musculoskeletal: Negative for gait problem or joint swelling.  Skin: Negative for rash.  Neurological: Negative  for dizziness or headache.  No other specific complaints in a complete review of systems (except as listed in HPI above).   Objective  Vitals:   10/07/21 0832  BP: 104/66  Pulse: 99  Resp: 16  Temp: 97.6 F (36.4 C)  TempSrc: Oral  SpO2: 99%  Weight: 216 lb 6.4 oz (98.2 kg)  Height: 6' (1.829 m)    Body mass index is 29.35 kg/m.  Physical Exam  Constitutional: Patient appears well-developed and well-nourished. Overweight.  No distress.  HEENT: head atraumatic, normocephalic, pupils equal and reactive to light, neck supple, throat within normal limits Cardiovascular: Normal rate, iregular rhythm and normal heart sounds.  No murmur heard. No BLE edema. Pulmonary/Chest: Effort normal and breath sounds normal. No respiratory distress. Abdominal: Soft.  There is no tenderness. Psychiatric: Patient has a normal mood and affect. behavior is normal. Judgment and thought content normal.   PHQ2/9: Depression screen [facility] 2/9 10/07/2021 11/16/2020 11/10/2019 09/15/2019 03/12/2016  Decreased Interest 0 0 0 0 0  Down, Depressed, Hopeless 0 0 0 0 0  PHQ - 2 Score 0 0 0 0 0  Altered sleeping 0 0 0 0 -  Tired, decreased energy 0 0 0 0 -  Change in appetite 0 0 0 0 -  Feeling bad or failure about yourself  0 0 0 0 -  Trouble concentrating 0 0 0 0 -  Moving slowly or fidgety/restless 0 0 0 0 -  Suicidal thoughts 0 0 0 0 -  PHQ-9 Score 0 0 0 0 -  Difficult doing work/chores Not difficult at all - Not difficult at all - -    phq 9 is negative   Fall Risk: Fall Risk  10/07/2021 11/16/2020 11/10/2019 09/15/2019 03/12/2016  Falls in the past year? 0 0 0 0 No  Number falls in past yr: 0 0 0 0 -  Injury with Fall? 0 0 0 0 -  Risk for fall due to : No Fall Risks - - - -  Follow up Falls prevention discussed - Falls evaluation completed - -      Functional Status Survey: Is the patient deaf or have difficulty hearing?: No Does the patient have difficulty seeing, even when wearing  glasses/contacts?: No Does the patient have difficulty concentrating, remembering, or making decisions?: No Does the patient have difficulty walking or climbing stairs?: No Does the patient have difficulty dressing or bathing?: No Does the patient have difficulty doing errands alone such as visiting a doctor's office or shopping?: No    Assessment & Plan  1. Pure hypercholesterolemia  - rosuvastatin (CRESTOR) 10 MG tablet; Take 1 tablet (10 mg total) by mouth daily.  Dispense: 90 tablet; Refill: 3 - Lipid panel  2. Vitamin B 12  deficiency  - Vitamin B12  3. Vitamin D deficiency  - VITAMIN D 25 Hydroxy (Vit-D Deficiency, Fractures)  4. Chronic a-fib (HCC)  Rate controlled  5. Chronic systolic (congestive) heart failure (H[facility])   6. Purpura senilis ([place])   7. Hypogonadism in male   8. Encounter for long-term (current) use of high-risk medication  - comp panel  - CBC with Differential/Platelet  9. Need for Tdap vaccination  refused  10. Prostate cancer screening  - PSA  11. Diabetes mellitus screening  - Hemoglobin A1c

## 2021-10-07 ENCOUNTER — Encounter: Payer: Self-pay | Admitting: Family Medicine

## 2021-10-07 ENCOUNTER — Ambulatory Visit: Payer: 59 | Admitting: Family Medicine

## 2021-10-07 VITALS — BP 104/66 | HR 99 | Temp 97.6°F | Resp 16 | Ht 72.0 in | Wt 216.4 lb

## 2021-10-07 DIAGNOSIS — Z79899 Other long term (current) drug therapy: Secondary | ICD-10-CM

## 2021-10-07 DIAGNOSIS — D692 Other nonthrombocytopenic purpura: Secondary | ICD-10-CM

## 2021-10-07 DIAGNOSIS — E559 Vitamin D deficiency, unspecified: Secondary | ICD-10-CM | POA: Diagnosis not present

## 2021-10-07 DIAGNOSIS — E7800 Pure hypercholesterolemia, unspecified: Secondary | ICD-10-CM | POA: Diagnosis not present

## 2021-10-07 DIAGNOSIS — E538 Deficiency of other specified B group vitamins: Secondary | ICD-10-CM

## 2021-10-07 DIAGNOSIS — I482 Chronic atrial fibrillation, unspecified: Secondary | ICD-10-CM | POA: Diagnosis not present

## 2021-10-07 DIAGNOSIS — E291 Testicular hypofunction: Secondary | ICD-10-CM

## 2021-10-07 DIAGNOSIS — Z131 Encounter for screening for diabetes mellitus: Secondary | ICD-10-CM

## 2021-10-07 DIAGNOSIS — Z23 Encounter for immunization: Secondary | ICD-10-CM

## 2021-10-07 DIAGNOSIS — Z125 Encounter for screening for malignant neoplasm of prostate: Secondary | ICD-10-CM

## 2021-10-07 DIAGNOSIS — I5022 Chronic systolic (congestive) heart failure: Secondary | ICD-10-CM

## 2021-10-07 MED ORDER — ROSUVASTATIN CALCIUM 10 MG PO TABS: 10.0000 mg | ORAL_TABLET | Freq: Every day | ORAL | 3 refills | Status: DC

## 2021-10-07 NOTE — Patient Instructions (Addendum)
Check with insurance Shing[employer] and Tdap coverage  Labs after Feb 16 th

## 2021-10-22 ENCOUNTER — Telehealth: Payer: Self-pay

## 2021-10-22 NOTE — Telephone Encounter (Signed)
Spoke with patient, labs printed

## 2021-10-22 NOTE — Telephone Encounter (Signed)
Copied from [place] [id number]. Topic: General - Other >> Oct 02, 2021  2:56 PM Valere Dross wrote: Reason for CRM: Pt called in stating he would like to come and pick up his lab orders to take them to get done before his upcoming physical. Please advise. >> Oct 02, 2021  3:12 PM [address], [place] L wrote: This is not our patient

## 2021-11-27 LAB — COMPREHENSIVE METABOLIC PANEL
ALT: 23 IU/L (ref 0–44)
AST: 21 IU/L (ref 0–40)
Albumin/Globulin Ratio: 1.5 (ref 1.2–2.2)
Albumin: 4.3 g/dL (ref 3.8–4.8)
Alkaline Phosphatase: 62 IU/L (ref 44–121)
BUN/Creatinine Ratio: 26 — ABNORMAL HIGH (ref 10–24)
BUN: 23 mg/dL (ref 8–27)
Bilirubin Total: 0.6 mg/dL (ref 0.0–1.2)
CO2: 23 mmol/L (ref 20–29)
Calcium: 9.4 mg/dL (ref 8.6–10.2)
Chloride: 103 mmol/L (ref 96–106)
Creatinine, Ser: 0.9 mg/dL (ref 0.76–1.27)
Globulin, Total: 2.9 g/dL (ref 1.5–4.5)
Glucose: 99 mg/dL (ref 70–99)
Potassium: 4 mmol/L (ref 3.5–5.2)
Sodium: 140 mmol/L (ref 134–144)
Total Protein: 7.2 g/dL (ref 6.0–8.5)
eGFR: 93 mL/min/{1.73_m2} (ref >59)

## 2021-11-27 LAB — CBC WITH DIFFERENTIAL/PLATELET
Basophils Absolute: 0 10*3/uL (ref 0.0–0.2)
Basos: 0 %
EOS (ABSOLUTE): 0.4 10*3/uL (ref 0.0–0.4)
Eos: 6 %
Hematocrit: 44.2 % (ref 37.5–51.0)
Hemoglobin: 15.2 g/dL (ref 13.0–17.7)
Immature Grans (Abs): 0 10*3/uL (ref 0.0–0.1)
Immature Granulocytes: 0 %
Lymphocytes Absolute: 1.7 10*3/uL (ref 0.7–3.1)
Lymphs: 25 %
MCH: 30.2 pg (ref 26.6–33.0)
MCHC: 34.4 g/dL (ref 31.5–35.7)
MCV: 88 fL (ref 79–97)
Monocytes Absolute: 0.6 10*3/uL (ref 0.1–0.9)
Monocytes: 9 %
Neutrophils Absolute: 4.1 10*3/uL (ref 1.4–7.0)
Neutrophils: 60 %
Platelets: 151 10*3/uL (ref 150–450)
RBC: 5.04 x10E6/uL (ref 4.14–5.80)
RDW: 12.5 % (ref 11.6–15.4)
WBC: 6.9 10*3/uL (ref 3.4–10.8)

## 2021-11-27 LAB — LIPID PANEL
Chol/HDL Ratio: 3.6 ratio (ref 0.0–5.0)
Cholesterol, Total: 146 mg/dL (ref 100–199)
HDL: 41 mg/dL (ref >39)
LDL Chol Calc (NIH): 86 mg/dL (ref 0–99)
Triglycerides: 100 mg/dL (ref 0–149)
VLDL Cholesterol Cal: 19 mg/dL (ref 5–40)

## 2021-11-27 LAB — HEMOGLOBIN A1C
Est. average glucose Bld gHb Est-mCnc: 111 mg/dL
Hgb A1c MFr Bld: 5.5 % (ref 4.8–5.6)

## 2021-11-27 LAB — VITAMIN D 25 HYDROXY (VIT D DEFICIENCY, FRACTURES): Vit D, 25-Hydroxy: 41.3 ng/mL (ref 30.0–100.0)

## 2021-11-27 LAB — VITAMIN B12: Vitamin B-12: 1125 pg/mL (ref 232–1245)

## 2021-11-27 LAB — PSA: Prostate Specific Ag, Serum: 1.9 ng/mL (ref 0.0–4.0)

## 2021-12-19 ENCOUNTER — Encounter: Payer: 59 | Admitting: Family Medicine

## 2022-03-26 NOTE — Patient Instructions (Signed)
Preventive Care 65 Years and Older, Male Preventive care refers to lifestyle choices and visits with your health care provider that can promote health and wellness. Preventive care visits are also called wellness exams. What can I expect for my preventive care visit? Counseling During your preventive care visit, your health care provider may ask about your: Medical history, including: Past medical problems. Family medical history. History of falls. Current health, including: Emotional well-being. Home life and relationship well-being. Sexual activity. Memory and ability to understand (cognition). Lifestyle, including: Alcohol, nicotine or tobacco, and drug use. Access to firearms. Diet, exercise, and sleep habits. Work and work environment. Sunscreen use. Safety issues such as seatbelt and bike helmet use. Physical exam Your health care provider will check your: Height and weight. These may be used to calculate your BMI (body mass index). BMI is a measurement that tells if you are at a healthy weight. Waist circumference. This measures the distance around your waistline. This measurement also tells if you are at a healthy weight and may help predict your risk of certain diseases, such as type 2 diabetes and high blood pressure. Heart rate and blood pressure. Body temperature. Skin for abnormal spots. What immunizations do I need?  Vaccines are usually given at various ages, according to a schedule. Your health care provider will recommend vaccines for you based on your age, medical history, and lifestyle or other factors, such as travel or where you work. What tests do I need? Screening Your health care provider may recommend screening tests for certain conditions. This may include: Lipid and cholesterol levels. Diabetes screening. This is done by checking your blood sugar (glucose) after you have not eaten for a while (fasting). Hepatitis C test. Hepatitis B test. HIV (human  immunodeficiency virus) test. STI (sexually transmitted infection) testing, if you are at risk. Lung cancer screening. Colorectal cancer screening. Prostate cancer screening. Abdominal aortic aneurysm (AAA) screening. You may need this if you are a current or former smoker. Talk with your health care provider about your test results, treatment options, and if necessary, the need for more tests. Follow these instructions at home: Eating and drinking  Eat a diet that includes fresh fruits and vegetables, whole grains, lean protein, and low-fat dairy products. Limit your intake of foods with high amounts of sugar, saturated fats, and salt. Take vitamin and mineral supplements as recommended by your health care provider. Do not drink alcohol if your health care provider tells you not to drink. If you drink alcohol: Limit how much you have to 0-2 drinks a day. Know how much alcohol is in your drink. In the [place] one drink equals one 12 oz bottle of beer (355 mL), one 5 oz glass of wine (148 mL), or one 1 oz glass of hard liquor (44 mL). Lifestyle Brush your teeth every morning and night with fluoride toothpaste. Floss one time each day. Exercise for at least 30 minutes 5 or more days each week. Do not use any products that contain nicotine or tobacco. These products include cigarettes, chewing tobacco, and vaping devices, such as e-cigarettes. If you need help quitting, ask your health care provider. Do not use drugs. If you are sexually active, practice safe sex. Use a condom or other form of protection to prevent STIs. Take aspirin only as told by your health care provider. Make sure that you understand how much to take and what form to take. Work with your health care provider to find out whether it is safe   and beneficial for you to take aspirin daily. Ask your health care provider if you need to take a cholesterol-lowering medicine (statin). Find healthy ways to manage stress, such  as: Meditation, yoga, or listening to music. Journaling. Talking to a trusted person. Spending time with friends and family. Safety Always wear your seat belt while driving or riding in a vehicle. Do not drive: If you have been drinking alcohol. Do not ride with someone who has been drinking. When you are tired or distracted. While texting. If you have been using any mind-altering substances or drugs. Wear a helmet and other protective equipment during sports activities. If you have firearms in your house, make sure you follow all gun safety procedures. Minimize exposure to UV radiation to reduce your risk of skin cancer. What's next? Visit your health care provider once a year for an annual wellness visit. Ask your health care provider how often you should have your eyes and teeth checked. Stay up to date on all vaccines. This information is not intended to replace advice given to you by your health care provider. Make sure you discuss any questions you have with your health care provider. Document Revised: 03/13/2021 Document Reviewed: 03/13/2021 Elsevier Patient Education  2023 [employer]  

## 2022-03-26 NOTE — Progress Notes (Unsigned)
Name: James Newman   MRN: [id number]    DOB: [birth year]   Date:03/27/2022       Progress Note  Subjective  Chief Complaint  Annual Exam  HPI  Patient presents for annual CPE.  Rash: erythematous rash on left cheek , going on for months, not painful or itchy, but gets flaky at times, using otc medication cream.   IPSS Questionnaire (AUA-7): Over the past month.   1)  How often have you had a sensation of not emptying your bladder completely after you finish urinating?  0 - Not at all  2)  How often have you had to urinate again less than two hours after you finished urinating? 0 - Not at all  3)  How often have you found you stopped and started again several times when you urinated?  0 - Not at all  4) How difficult have you found it to postpone urination?  0 - Not at all  5) How often have you had a weak urinary stream?  0 - Not at all  6) How often have you had to push or strain to begin urination?  0 - Not at all  7) How many times did you most typically get up to urinate from the time you went to bed until the time you got up in the morning?  0 - None  Total score:  0-7 mildly symptomatic   8-19 moderately symptomatic   20-35 severely symptomatic     Diet: he is on keto diet  Exercise: discussed 150 minutes per week   Depression: phq 9 is negative    03/27/2022    8:30 AM 10/07/2021    8:25 AM 11/16/2020    7:46 AM 11/10/2019    9:15 AM 09/15/2019   11:22 AM  Depression screen PHQ 2/9  Decreased Interest 0 0 0 0 0  Down, Depressed, Hopeless 0 0 0 0 0  PHQ - 2 Score 0 0 0 0 0  Altered sleeping 0 0 0 0 0  Tired, decreased energy 0 0 0 0 0  Change in appetite 0 0 0 0 0  Feeling bad or failure about yourself  0 0 0 0 0  Trouble concentrating 0 0 0 0 0  Moving slowly or fidgety/restless 0 0 0 0 0  Suicidal thoughts 0 0 0 0 0  PHQ-9 Score 0 0 0 0 0  Difficult doing work/chores  Not difficult at all  Not difficult at all     Hypertension:  BP Readings from Last 3  Encounters:  03/27/22 122/72  10/07/21 104/66  11/16/20 120/68    Obesity: Wt Readings from Last 3 Encounters:  03/27/22 216 lb (98 kg)  10/07/21 216 lb 6.4 oz (98.2 kg)  11/16/20 208 lb 4.8 oz (94.5 kg)   BMI Readings from Last 3 Encounters:  03/27/22 29.29 kg/m  10/07/21 29.35 kg/m  11/16/20 28.25 kg/m     Lipids:  Lab Results  Component Value Date   CHOL 146 11/26/2021   CHOL 153 07/05/2021   CHOL 197 11/14/2020   Lab Results  Component Value Date   HDL 41 11/26/2021   HDL 39 (L) 07/05/2021   HDL 47 11/14/2020   Lab Results  Component Value Date   LDLCALC 86 11/26/2021   LDLCALC 86 07/05/2021   LDLCALC 134 (H) 11/14/2020   Lab Results  Component Value Date   TRIG 100 11/26/2021   TRIG 158 (H) 07/05/2021   TRIG 89 11/14/2020  Lab Results  Component Value Date   CHOLHDL 3.6 11/26/2021   CHOLHDL 3.9 07/05/2021   CHOLHDL 4.2 11/14/2020   No results found for: "LDLDIRECT" Glucose:  Glucose  Date Value Ref Range Status  11/26/2021 99 70 - 99 mg/dL Final  11/14/2020 94 65 - 99 mg/dL Final  11/07/2019 83 65 - 99 mg/dL Final   Glucose, Bld  Date Value Ref Range Status  02/13/2016 108 (H) 65 - 99 mg/dL Final  02/12/2016 116 (H) 65 - 99 mg/dL Final  02/11/2016 108 (H) 65 - 99 mg/dL Final    [place] Visit from [facility]/10/[facility] in [facility]  AUDIT-C Score 1      Married STD testing and prevention (HIV/chl/gon/syphilis):  N/A Sexual history: not sexually active  Hep C Screening: 11/07/19 Skin cancer: Discussed monitoring for atypical lesions Colorectal cancer: 09/23/12 Prostate cancer:  last PSA done 10/2021 and at goal   Lung cancer:  Low Dose CT Chest recommended if Age 18-80 years, 30 pack-year currently smoking OR have quit w/in 15years. Patient  no a candidate for screening   AAA: The USPSTF recommends one-time screening with ultrasonography in men ages 86 to 80 years who have ever smoked. Patient is not  a  candidate for screening  ECG:08/2021 by cardiologist   Vaccines:   HPV: N/A Tdap: 2011 Shingrix: discussed benefit of vaccination  Pneumonia: 2020  (Pneumococcal-13), discussed PCV 20 again  Flu: up to date COVID-19: refused   Advanced Care Planning: A voluntary discussion about advance care planning including the explanation and discussion of advance directives.  Discussed health care proxy and Living will, and the patient was able to identify a health care proxy as wife.  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   Obesity (BMI 30-39.9) 03/14/2015   Vitamin D deficiency 03/14/2015   Chronic a-fib ([place]) 03/08/2007    Past Surgical History:  Procedure Laterality Date   NASAL SEPTUM SURGERY      Family History  Problem Relation Age of Onset   COPD Mother    Hypertension Son    Obesity Son     Social History   Socioeconomic History   Marital status: Married    Spouse name: Thayer Headings   Number of children: 1   Years of education: Not on file   Highest education level: Not on file  Occupational History   Not on file  Tobacco Use   Smoking status: Never   Smokeless tobacco: Never  Vaping Use   Vaping Use: Never used  Substance and Sexual Activity   Alcohol use: Yes    Alcohol/week: 2.0 standard drinks of alcohol    Types: 2 Standard drinks or equivalent per week   Drug use: No   Sexual activity: Not Currently    Partners:  Female  Other Topics Concern   Not on file  Social History Narrative   Not on file   Social Determinants of Health   Financial Resource  Strain: Low Risk  (03/27/2022)   Overall Financial Resource Strain (CARDIA)    Difficulty of Paying Living Expenses: Not hard at all  Food Insecurity: No Food Insecurity (03/27/2022)   Hunger Vital Sign    Worried About Running Out of Food in the Last Year: Never true    [place] in the Last Year: Never true  Transportation Needs: No Transportation Needs (03/27/2022)   PRAPARE - Hydrologist (Medical): No    Lack of Transportation (Non-Medical): No  Physical Activity: Insufficiently Active (03/27/2022)   Exercise Vital Sign    Days of Exercise per Week: 4 days    Minutes of Exercise per Session: 30 min  Stress: No Stress Concern Present (03/27/2022)   [place]    Feeling of Stress : Not at all  Social Connections: [place] (03/27/2022)   Social Connection and Isolation Panel [NHANES]    Frequency of Communication with Friends and Family: More than three times a week    Frequency of Social Gatherings with Friends and Family: More than three times a week    Attends Religious Services: More than 4 times per year    Active Member of [employer] or Organizations: Yes    Attends Music therapist: More than 4 times per year    Marital Status: Married  Human resources officer Violence: Not At Risk (03/27/2022)   Humiliation, Afraid, Rape, and Kick questionnaire    Fear of Current or Ex-Partner: No    Emotionally Abused: No    Physically Abused: No    Sexually Abused: No     Current Outpatient Medications:    cholecalciferol (VITAMIN D) 1000 units tablet, Take 1,000 Units by mouth daily., Disp: , Rfl:    diclofenac Sodium (VOLTAREN) 1 % GEL, Apply 2 g topically 4 (four) times daily., Disp: 100 g, Rfl: 0   diltiazem (CARDIZEM CD) 240 MG 24 hr  capsule, Take 1 capsule (240 mg total) by mouth daily., Disp: 30 capsule, Rfl: 5   furosemide (LASIX) 40 MG tablet, Take 1 tablet (40 mg total) by mouth daily., Disp: 30 tablet, Rfl: 5   lisinopril (PRINIVIL,ZESTRIL) 5 MG tablet, Take 1 tablet (5 mg total) by mouth daily., Disp: 30 tablet, Rfl: 5   metoprolol succinate (TOPROL-XL) 50 MG 24 hr tablet, Take 1 tablet (50 mg total) by mouth daily. Take with or immediately following a meal., Disp: 30 tablet, Rfl: 5   rosuvastatin (CRESTOR) 10 MG tablet, Take 1 tablet (10 mg total) by mouth daily., Disp: 90 tablet, Rfl: 3   vitamin B-12 (CYANOCOBALAMIN) 1000 MCG tablet, Take 1,000 mcg by mouth daily., Disp: , Rfl:    warfarin (COUMADIN) 5 MG tablet, Take 1 tablet (5 mg total) by mouth daily. Take one tablet by mouth daily, Disp: 45 tablet, Rfl: 5   zinc gluconate 50 MG tablet, Take 1 tablet by mouth daily., Disp: , Rfl:   Allergies  Allergen Reactions   Diltiazem Cough   Lisinopril Cough     ROS  Constitutional: Negative for fever or weight change.  Respiratory: Negative for cough and shortness of breath.   Cardiovascular: Negative for chest pain or palpitations.  Gastrointestinal: Negative for abdominal pain, no bowel changes.  Musculoskeletal: Negative for gait problem or joint swelling.  Skin: senile purpura also rash on face and umbilicus  Neurological: Negative for dizziness or headache.  No other specific complaints in a complete review of  systems (except as listed in HPI above).    Objective  Vitals:   03/27/22 0835  BP: 122/72  Pulse: 90  Resp: 16  SpO2: 95%  Weight: 216 lb (98 kg)  Height: 6' (1.829 m)    Body mass index is 29.29 kg/m.  Physical Exam  Constitutional: Patient appears well-developed and well-nourished. No distress.  HENT: Head: Normocephalic and atraumatic. Ears: B TMs ok, no erythema or effusion; Nose: Nose normal. Mouth/Throat: not done  Eyes: Conjunctivae and EOM are normal. Pupils are equal, round,  and reactive to light. No scleral icterus.  Neck: Normal range of motion. Neck supple. No JVD present. No thyromegaly present.  Cardiovascular: Normal rate, irregular rhythm and normal heart sounds.  No murmur heard. Trace BLE edema. Pulmonary/Chest: Effort normal and breath sounds normal. No respiratory distress. Abdominal: Soft. Bowel sounds are normal, no distension. There is no tenderness. no masses MALE GENITALIA: refused  RECTAL: discussed USPTF Musculoskeletal: Normal range of motion, no joint effusions. No gross deformities Neurological: he is alert and oriented to person, place, and time. No cranial nerve deficit. Coordination, balance, strength, speech and gait are normal.  Skin: Skin is warm and dry. senile purpura both arms, he also has an irregular erythematous macule on left side of cheek with some flakiness, picture attached and maceration with some erythema on umbilicus Psychiatric: Patient has a normal mood and affect. behavior is normal. Judgment and thought content normal.    Fall Risk:    03/27/2022    8:30 AM 10/07/2021    8:25 AM 11/16/2020    7:46 AM 11/10/2019    9:15 AM 09/15/2019   11:21 AM  Fall Risk   Falls in the past year? 0 0 0 0 0  Number falls in past yr: 0 0 0 0 0  Injury with Fall? 0 0 0 0 0  Risk for fall due to : No Fall Risks No Fall Risks     Follow up Falls prevention discussed Falls prevention discussed  Falls evaluation completed      Functional Status Survey: Is the patient deaf or have difficulty hearing?: No Does the patient have difficulty seeing, even when wearing glasses/contacts?: No Does the patient have difficulty concentrating, remembering, or making decisions?: No Does the patient have difficulty walking or climbing stairs?: No Does the patient have difficulty dressing or bathing?: No Does the patient have difficulty doing errands alone such as visiting a doctor's office or shopping?: No    Assessment & Plan  1. Well adult  exam   2. Colon cancer screening  - Ambulatory referral to Gastroenterology  3. Rash of face  - clindamycin (CLINDAGEL) 1 % gel; Apply topically 2 (two) times daily. On face rash  Dispense: 30 g; Refill: 0  4. Intertriginous candidiasis  - Clotrimazole 1 % OINT; Apply 1 each topically daily. Umbilicus  Dispense: 07.6 g; Refill: 1     -Prostate cancer screening and PSA options (with potential risks and benefits of testing vs not testing) were discussed along with recent recs/guidelines. -USPSTF grade A and B recommendations reviewed with patient; age-appropriate recommendations, preventive care, screening tests, etc discussed and encouraged; healthy living encouraged; see AVS for patient education given to patient -Discussed importance of 150 minutes of physical activity weekly, eat two servings of fish weekly, eat one serving of tree nuts ( cashews, pistachios, pecans, almonds.Marland Kitchen) every other day, eat 6 servings of fruit/vegetables daily and drink plenty of water and avoid sweet beverages.  -Reviewed Health Maintenance:  yes

## 2022-03-27 ENCOUNTER — Ambulatory Visit (INDEPENDENT_AMBULATORY_CARE_PROVIDER_SITE_OTHER): Payer: 59 | Admitting: Family Medicine

## 2022-03-27 ENCOUNTER — Encounter: Payer: Self-pay | Admitting: Family Medicine

## 2022-03-27 VITALS — BP 122/72 | HR 90 | Resp 16 | Ht 72.0 in | Wt 216.0 lb

## 2022-03-27 DIAGNOSIS — R21 Rash and other nonspecific skin eruption: Secondary | ICD-10-CM

## 2022-03-27 DIAGNOSIS — Z0000 Encounter for general adult medical examination without abnormal findings: Secondary | ICD-10-CM

## 2022-03-27 DIAGNOSIS — B372 Candidiasis of skin and nail: Secondary | ICD-10-CM | POA: Diagnosis not present

## 2022-03-27 DIAGNOSIS — Z1211 Encounter for screening for malignant neoplasm of colon: Secondary | ICD-10-CM

## 2022-03-27 MED ORDER — CLINDAMYCIN PHOSPHATE 1 % EX GEL: Freq: Two times a day (BID) | CUTANEOUS | 0 refills | Status: DC

## 2022-03-27 MED ORDER — CLOTRIMAZOLE 1 % EX OINT: 1.0000 | TOPICAL_OINTMENT | Freq: Every day | CUTANEOUS | 1 refills | Status: DC

## 2022-04-10 ENCOUNTER — Ambulatory Visit: Payer: 59 | Admitting: Nurse Practitioner

## 2022-04-16 ENCOUNTER — Other Ambulatory Visit: Payer: Self-pay

## 2022-04-16 DIAGNOSIS — E7800 Pure hypercholesterolemia, unspecified: Secondary | ICD-10-CM

## 2022-04-16 MED ORDER — ROSUVASTATIN CALCIUM 10 MG PO TABS: 10.0000 mg | ORAL_TABLET | Freq: Every day | ORAL | 1 refills | Status: DC

## 2022-07-16 ENCOUNTER — Ambulatory Visit: Payer: 59 | Admitting: Dermatology

## 2022-07-16 DIAGNOSIS — L821 Other seborrheic keratosis: Secondary | ICD-10-CM

## 2022-07-16 DIAGNOSIS — L570 Actinic keratosis: Secondary | ICD-10-CM | POA: Diagnosis not present

## 2022-07-16 DIAGNOSIS — L719 Rosacea, unspecified: Secondary | ICD-10-CM

## 2022-07-16 DIAGNOSIS — L578 Other skin changes due to chronic exposure to nonionizing radiation: Secondary | ICD-10-CM | POA: Diagnosis not present

## 2022-07-16 NOTE — Patient Instructions (Addendum)
Cryotherapy Aftercare  Wash gently with soap and water everyday.   Apply Vaseline and Band-Aid daily until healed.    Actinic Keratosis  What is an actinic keratosis? An actinic keratosis (plural: actinic keratoses) is growth on the surface of the skin that usually appears as a red, hard, crusty or scaly bump.   What causes actinic keratoses? Repeated prolonged sun exposure causes skin damage, especially in fair-skinned persons. Sun-damaged skin becomes dry and wrinkled and may form rough, scaly spots called actinic keratoses. These rough spots remain on the skin even though the crust or scale on top is picked off.   Why treat actinic keratoses? Actinic keratoses are not skin cancers, but because they may sometimes turn cancerous they are called "pre-cancerous". Not all will turn to skin cancer, and it usually takes several years for this to happen. Because it is much easier to treat an actinic keratosis then it is to remove a skin cancer, actinic keratoses should be treated to prevent future skin cancer.   How are actinic keratoses treated? The most common way of treating actinic keratoses is to freeze them with liquid nitrogen. Freezing causes scabbing and shedding of the sun-damaged skin. Healing after a removal usually takes two weeks, depending on the size and location of the keratosis. Hands and legs heal more slowly than the face. The skin's final appearance is usually excellent. There are several topical medications that can be used to treat actinic keratoses. These medications generally have side effects of redness, crusting, and pain. Some are used for a few days, and some for several months before the actinic keratosis is completely gone. Photodynamic therapy is another alternative to freezing actinic keratoses. This treatment is done in a physician's office. A medication is applied to the area of skin with actinic keratoses, and it is allowed to soak in for one or more hours. A special  light is then applied to the skin. Side effects include redness, burning, and peeling.  How can you prevent actinic keratoses? Protection from the sun is the best way to prevent actinic keratoses. The use of proper clothing and sunscreens can prevent the sun damage that leads to an actinic keratosis.  Unfortunately, some sun damage is permanent. Once sun damage has progressed to the point where actinic keratoses develop, new keratoses may appear even without further sun exposure. However, even in skin that is already heavily sun damaged, good sun protection can help reduce the number of actinic keratoses that will appear.        Due to recent changes in healthcare laws, you may see results of your pathology and/or laboratory studies on MyChart before the doctors have had a chance to review them. We understand that in some cases there may be results that are confusing or concerning to you. Please understand that not all results are received at the same time and often the doctors may need to interpret multiple results in order to provide you with the best plan of care or course of treatment. Therefore, we ask that you please give [place] 2 business days to thoroughly review all your results before contacting the office for clarification. Should we see a critical lab result, you will be contacted sooner.   If You Need Anything After Your Visit  If you have any questions or concerns for your doctor, please call our main line at [phone] and press option 4 to reach your doctor's medical assistant. If no one answers, please leave a voicemail as directed and  we will return your call as soon as possible. Messages left after 4 pm will be answered the following business day.   You may also send [place] a message via [place]. We typically respond to MyChart messages within 1-2 business days.  For prescription refills, please ask your pharmacy to contact our office. Our fax number is [phone].  If you have an  urgent issue when the clinic is closed that cannot wait until the next business day, you can page your doctor at the number below.    Please note that while we do our best to be available for urgent issues outside of office hours, we are not available 24/7.   If you have an urgent issue and are unable to reach [place], you may choose to seek medical care at your doctor's office, retail clinic, urgent care center, or emergency room.  If you have a medical emergency, please immediately call 911 or go to the emergency department.  Pager Numbers  - Dr. Nehemiah Massed: [phone]  - Dr. Laurence Ferrari: [phone]  - Dr. Nicole Kindred: [phone]  In the event of inclement weather, please call our main line at [phone] for an update on the status of any delays or closures.  Dermatology Medication Tips: Please keep the boxes that topical medications come in in order to help keep track of the instructions about where and how to use these. Pharmacies typically print the medication instructions only on the boxes and not directly on the medication tubes.   If your medication is too expensive, please contact our office at [phone] option 4 or send [place] a message through [place].   We are unable to tell what your co-pay for medications will be in advance as this is different depending on your insurance coverage. However, we may be able to find a substitute medication at lower cost or fill out paperwork to get insurance to cover a needed medication.   If a prior authorization is required to get your medication covered by your insurance company, please allow [place] 1-2 business days to complete this process.  Drug prices often vary depending on where the prescription is filled and some pharmacies may offer cheaper prices.  The website www.goodrx.com contains coupons for medications through different pharmacies. The prices here do not account for what the cost may be with help from insurance (it may be cheaper with your  insurance), but the website can give you the price if you did not use any insurance.  - You can print the associated coupon and take it with your prescription to the pharmacy.  - You may also stop by our office during regular business hours and pick up a GoodRx coupon card.  - If you need your prescription sent electronically to a different pharmacy, notify our office through [facility] or by phone at [phone] option 4.     Si Usted Necesita Algo Despus de Su Visita  Tambin puede enviarnos un mensaje a travs de Pharmacist, community. Por lo general respondemos a los mensajes de MyChart en el transcurso de 1 a 2 das hbiles.  Para renovar recetas, por favor pida a su farmacia que se ponga en contacto con nuestra oficina. Harland Dingwall de fax es [place] [phone].  Si tiene un asunto urgente cuando la clnica est cerrada y que no puede esperar hasta el siguiente da hbil, puede llamar/localizar a su doctor(a) al nmero que aparece a continuacin.   Por favor, tenga en cuenta que aunque hacemos todo lo posible para estar disponibles  para asuntos urgentes fuera del horario de [place], no estamos disponibles las 24 horas del da, los 7 das de la [place].   Si tiene un problema urgente y no puede comunicarse con nosotros, puede optar por buscar atencin mdica  en el consultorio de su doctor(a), en una clnica privada, en un centro de atencin urgente o en una sala de emergencias.  Si tiene Engineering geologist, por favor llame inmediatamente al 911 o vaya a la sala de emergencias.  Nmeros de bper  - Dr. Nehemiah Massed: [phone]  - Dra. Moye: [phone]  - Dra. Nicole Kindred: [phone]  En caso de inclemencias del [place], por favor llame a Johnsie Kindred principal al [phone] para una actualizacin sobre el [place] de cualquier retraso o cierre.  Consejos para la medicacin en dermatologa: Por favor, guarde las cajas en las que vienen los medicamentos de uso tpico para ayudarle a  seguir las instrucciones sobre dnde y cmo usarlos. Las farmacias generalmente imprimen las instrucciones del medicamento slo en las cajas y no directamente en los tubos del [place].   Si su medicamento es muy caro, por favor, pngase en contacto con Zigmund Daniel llamando al [phone] y presione la opcin 4 o envenos un mensaje a travs de Pharmacist, community.   No podemos decirle cul ser su copago por los medicamentos por adelantado ya que esto es diferente dependiendo de la cobertura de su seguro. Sin embargo, es posible que podamos encontrar un medicamento sustituto a Electrical engineer un formulario para que el seguro cubra el medicamento que se considera necesario.   Si se requiere una autorizacin previa para que su compaa de seguros [place] su medicamento, por favor permtanos de 1 a 2 das hbiles para completar este proceso.  Los precios de los medicamentos varan con frecuencia dependiendo del Environmental consultant de dnde se surte la receta y alguna farmacias pueden ofrecer precios ms baratos.  El sitio web www.goodrx.com tiene cupones para medicamentos de Airline pilot. Los precios aqu no tienen en cuenta lo que podra costar con la ayuda del seguro (puede ser ms barato con su seguro), pero el sitio web puede darle el precio si no utiliz Research scientist (physical sciences).  - Puede imprimir el cupn correspondiente y llevarlo con su receta a la farmacia.  - Tambin puede pasar por nuestra oficina durante el horario de atencin regular y Charity fundraiser una tarjeta de cupones de GoodRx.  - Si necesita que su receta se enve electrnicamente a una farmacia diferente, informe a nuestra oficina a travs de MyChart de [place] o por telfono llamando al [phone] y presione la opcin 4.

## 2022-07-16 NOTE — Progress Notes (Signed)
   New Patient Visit  Subjective  James Newman is a 71 y.o. male who presents for the following: red patch (L cheek, 64m scaly, itchy, pt thinks hx of [address]2 ~[address]). The patient has spots, moles and lesions to be evaluated, some may be new or changing and the patient has concerns that these could be cancer.  The following portions of the chart were reviewed this encounter and updated as appropriate:   Tobacco  Allergies  Meds  Problems  Med Hx  Surg Hx  Fam Hx     Review of Systems:  No other skin or systemic complaints except as noted in HPI or Assessment and Plan.  Objective  Well appearing patient in no apparent distress; mood and affect are within normal limits.  A focused examination was performed including face, arms. Relevant physical exam findings are noted in the Assessment and Plan.  L medial cheek x 3, L zygoma x 1 (4) Pink scaly macules  face Erythema and dilated vessels cheeks, nose   Assessment & Plan   Seborrheic Keratoses - Stuck-on, waxy, tan-brown papules and/or plaques  - Benign-appearing - Discussed benign etiology and prognosis. - Observe - Call for any changes  Actinic Damage - chronic, secondary to cumulative UV radiation exposure/sun exposure over time - diffuse scaly erythematous macules with underlying dyspigmentation - Recommend daily broad spectrum sunscreen SPF 30+ to sun-exposed areas, reapply every 2 hours as needed.  - Recommend staying in the shade or wearing long sleeves, sun glasses (UVA+UVB protection) and wide brim hats (4-inch brim around the entire circumference of the hat). - Call for new or changing lesions.   AK (actinic keratosis) (4) L medial cheek x 3, L zygoma x 1 Destruction of lesion - L medial cheek x 3, L zygoma x 1 Complexity: simple   Destruction method: cryotherapy   Informed consent: discussed and consent obtained   Timeout:  patient name, date of birth, surgical site, and procedure verified Lesion destroyed  using liquid nitrogen: Yes   Region frozen until ice ball extended beyond lesion: Yes   Outcome: patient tolerated procedure well with no complications   Post-procedure details: wound care instructions given    Rosacea face Rosacea is a chronic progressive skin condition usually affecting the face of adults, causing redness and/or acne bumps. It is treatable but not curable. It sometimes affects the eyes (ocular rosacea) as well. It may respond to topical and/or systemic medication and can flare with stress, sun exposure, alcohol, exercise, topical steroids (including hydrocortisone/cortisone 10) and some foods.  Daily application of broad spectrum spf 30+ sunscreen to face is recommended to reduce flares.  Discussed the treatment option of BBL/laser.  Typically we recommend 1-3 treatment sessions about 5-8 weeks apart for best results.  The patient's condition may require "maintenance treatments" in the future.  The fee for BBL / laser treatments is $350 per treatment session for the whole face.  A fee can be quoted for other parts of the body. Insurance typically does not pay for BBL/laser treatments and therefore the fee is an out-of-pocket cost.  Return in about 3 months (around 10/16/2022).  I, SoOthelia PullingRMA, am acting as scribe for DaSarina SerMD . Documentation: I have reviewed the above documentation for accuracy and completeness, and I agree with the above.  DaSarina SerMD

## 2022-07-26 ENCOUNTER — Encounter: Payer: Self-pay | Admitting: Dermatology

## 2022-10-27 ENCOUNTER — Ambulatory Visit: Payer: 59 | Admitting: Dermatology

## 2022-12-22 ENCOUNTER — Ambulatory Visit: Payer: 59 | Admitting: Dermatology

## 2022-12-22 ENCOUNTER — Encounter: Payer: Self-pay | Admitting: Dermatology

## 2022-12-22 VITALS — BP 142/91 | HR 74

## 2022-12-22 DIAGNOSIS — L570 Actinic keratosis: Secondary | ICD-10-CM | POA: Diagnosis not present

## 2022-12-22 DIAGNOSIS — L578 Other skin changes due to chronic exposure to nonionizing radiation: Secondary | ICD-10-CM | POA: Diagnosis not present

## 2022-12-22 DIAGNOSIS — Z79899 Other long term (current) drug therapy: Secondary | ICD-10-CM | POA: Diagnosis not present

## 2022-12-22 DIAGNOSIS — L821 Other seborrheic keratosis: Secondary | ICD-10-CM | POA: Diagnosis not present

## 2022-12-22 DIAGNOSIS — Z5111 Encounter for antineoplastic chemotherapy: Secondary | ICD-10-CM

## 2022-12-22 DIAGNOSIS — I781 Nevus, non-neoplastic: Secondary | ICD-10-CM

## 2022-12-22 NOTE — Progress Notes (Unsigned)
Follow-Up Visit   Subjective  James Newman is a 72 y.o. male who presents for the following: Actinic keratosis 3 month ak recheck at left cheek  The patient has spots, moles and lesions to be evaluated, some may be new or changing and the patient has concerns that these could be cancer.  The following portions of the chart were reviewed this encounter and updated as appropriate: medications, allergies, medical history  Review of Systems:  No other skin or systemic complaints except as noted in HPI or Assessment and Plan.  Objective  Well appearing patient in no apparent distress; mood and affect are within normal limits.  A focused examination was performed of the following areas: Face   Relevant exam findings are noted in the Assessment and Plan.   Assessment & Plan    ACTINIC KERATOSIS  Exam: Erythematous thin papules/macules with gritty scale  Actinic keratoses are precancerous spots that appear secondary to cumulative UV radiation exposure/sun exposure over time. They are chronic with expected duration over 1 year. A portion of actinic keratoses will progress to squamous cell carcinoma of the skin. It is not possible to reliably predict which spots will progress to skin cancer and so treatment is recommended to prevent development of skin cancer.  Recommend daily broad spectrum sunscreen SPF 30+ to sun-exposed areas, reapply every 2 hours as needed.  Recommend staying in the shade or wearing long sleeves, sun glasses (UVA+UVB protection) and wide brim hats (4-inch brim around the entire circumference of the hat). Call for new or changing lesions.  Treatment Plan:  Destruction Procedure Note Destruction method: cryotherapy   Informed consent: discussed and consent obtained   Lesion destroyed using liquid nitrogen: Yes   Outcome: patient tolerated procedure well with no complications   Post-procedure details: wound care instructions given   Locations: left cheek x 2 #  of Lesions Treated: 2  Prior to procedure, discussed risks of blister formation, small wound, skin dyspigmentation, or rare scar following cryotherapy. Recommend Vaseline ointment to treated areas while healing.  Seborrheic Keratoses - Stuck-on, waxy, tan-brown papules and/or plaques  - Benign-appearing - Discussed benign etiology and prognosis. - Observe - Call for any changes  Telangiectasia - Dilated blood vessel - Benign appearing on exam - Call for changes  Actinic Damage with PreCancerous Actinic Keratoses Counseling for Topical Chemotherapy Management: Patient exhibits: - Severe, confluent actinic changes with pre-cancerous actinic keratoses that is secondary to cumulative UV radiation exposure over time - Condition that is severe; chronic, not at goal. - diffuse scaly erythematous macules and papules with underlying dyspigmentation - Discussed Prescription "Field Treatment" topical Chemotherapy for Severe, Chronic Confluent Actinic Changes with Pre-Cancerous Actinic Keratoses Field treatment involves treatment of an entire area of skin that has confluent Actinic Changes (Sun/ Ultraviolet light damage) and PreCancerous Actinic Keratoses by method of PhotoDynamic Therapy (PDT) and/or prescription Topical Chemotherapy agents such as 5-fluorouracil, 5-fluorouracil/calcipotriene, and/or imiquimod.  The purpose is to decrease the number of clinically evident and subclinical PreCancerous lesions to prevent progression to development of skin cancer by chemically destroying early precancer changes that may or may not be visible.  It has been shown to reduce the risk of developing skin cancer in the treated area. As a result of treatment, redness, scaling, crusting, and open sores may occur during treatment course. One or more than one of these methods may be used and may have to be used several times to control, suppress and eliminate the PreCancerous changes. Discussed treatment course,  expected reaction, and possible side effects. - Recommend daily broad spectrum sunscreen SPF 30+ to sun-exposed areas, reapply every 2 hours as needed.  - Staying in the shade or wearing long sleeves, sun glasses (UVA+UVB protection) and wide brim hats (4-inch brim around the entire circumference of the hat) are also recommended. - Call for new or changing lesions.  Start in 1 month - Start 5-fluorouracil/calcipotriene cream twice a day for 7 days to affected areas including left cheek. Prescription sent to [facility]. Patient advised they will receive an email to purchase the medication online and have it sent to their home. Patient provided with handout reviewing treatment course and side effects and advised to call or message [place] on MyChart with any concerns.  Reviewed course of treatment and expected reaction.  Patient advised to expect inflammation and crusting and advised that erosions are possible.  Patient advised to be diligent with sun protection during and after treatment. Counseled to keep medication out of reach of children and pets.   Return in about 6 months (around 06/24/2023) for ak followup.  IRuthell Rummage, CMA, am acting as scribe for Sarina Ser, MD.  Documentation: I have reviewed the above documentation for accuracy and completeness, and I agree with the above.  Sarina Ser, MD

## 2022-12-22 NOTE — Patient Instructions (Addendum)
Start cream in 1 month   - Start 5-fluorouracil/calcipotriene cream twice a day for 7 days to affected areas including left cheek . Prescription sent to [facility]. Patient advised they will receive an email to purchase the medication online and have it sent to their home. Patient provided with handout reviewing treatment course and side effects and advised to call or message [place] on MyChart with any concerns.  Reviewed course of treatment and expected reaction.  Patient advised to expect inflammation and crusting and advised that erosions are possible.  Patient advised to be diligent with sun protection during and after treatment. Counseled to keep medication out of reach of children and pets.  Instructions for [facility]  One or more of your medications was sent to the [facility]. You will receive an email from them and can purchase the medicine through that link. It will then be mailed to your home at the address you confirmed. If for any reason you do not receive an email from them, please check your spam folder. If you still do not find the email, please let us know. [facility] phone number is [phone].      5-Fluorouracil/Calcipotriene Patient Education   Actinic keratoses are the dry, red scaly spots on the skin caused by sun damage. A portion of these spots can turn into skin cancer with time, and treating them can help prevent development of skin cancer.   Treatment of these spots requires removal of the defective skin cells. There are various ways to remove actinic keratoses, including freezing with liquid nitrogen, treatment with creams, or treatment with a blue light procedure in the office.   5-fluorouracil cream is a topical cream used to treat actinic keratoses. It works by interfering with the growth of abnormal fast-growing skin cells, such as actinic keratoses. These cells peel off and are  replaced by healthy ones.   5-fluorouracil/calcipotriene is a combination of the 5-fluorouracil cream with a vitamin D analog cream called calcipotriene. The calcipotriene alone does not treat actinic keratoses. However, when it is combined with 5-fluorouracil, it helps the 5-fluorouracil treat the actinic keratoses much faster so that the same results can be achieved with a much shorter treatment time.  INSTRUCTIONS FOR 5-FLUOROURACIL/CALCIPOTRIENE CREAM:   5-fluorouracil/calcipotriene cream typically only needs to be used for 4-7 days. A thin layer should be applied twice a day to the treatment areas recommended by your physician.   If your physician prescribed you separate tubes of 5-fluourouracil and calcipotriene, apply a thin layer of 5-fluorouracil followed by a thin layer of calcipotriene.   Avoid contact with your eyes, nostrils, and mouth. Do not use 5-fluorouracil/calcipotriene cream on infected or open wounds.   You will develop redness, irritation and some crusting at areas where you have pre-cancer damage/actinic keratoses. IF YOU DEVELOP PAIN, BLEEDING, OR SIGNIFICANT CRUSTING, STOP THE TREATMENT EARLY - you have already gotten a good response and the actinic keratoses should clear up well.  Wash your hands after applying 5-fluorouracil 5% cream on your skin.   A moisturizer or sunscreen with a minimum SPF 30 should be applied each morning.   Once you have finished the treatment, you can apply a thin layer of Vaseline twice a day to irritated areas to soothe and calm the areas more quickly. If you experience significant discomfort, contact your physician.  For some patients it is necessary to repeat the treatment for best results.  SIDE EFFECTS: When using 5-fluorouracil/calcipotriene  cream, you may have mild irritation, such as redness, dryness, swelling, or a mild burning sensation. This usually resolves within 2 weeks. The more actinic keratoses you have, the more redness and  inflammation you can expect during treatment. Eye irritation has been reported rarely. If this occurs, please let us know.  If you have any trouble using this cream, please call the office. If you have any other questions about this information, please do not hesitate to ask me before you leave the office.       Actinic keratoses are precancerous spots that appear secondary to cumulative UV radiation exposure/sun exposure over time. They are chronic with expected duration over 1 year. A portion of actinic keratoses will progress to squamous cell carcinoma of the skin. It is not possible to reliably predict which spots will progress to skin cancer and so treatment is recommended to prevent development of skin cancer.  Recommend daily broad spectrum sunscreen SPF 30+ to sun-exposed areas, reapply every 2 hours as needed.  Recommend staying in the shade or wearing long sleeves, sun glasses (UVA+UVB protection) and wide brim hats (4-inch brim around the entire circumference of the hat). Call for new or changing lesions.     Cryotherapy Aftercare  Wash gently with soap and water everyday.   Apply Vaseline and Band-Aid daily until healed.   Due to recent changes in healthcare laws, you may see results of your pathology and/or laboratory studies on MyChart before the doctors have had a chance to review them. We understand that in some cases there may be results that are confusing or concerning to you. Please understand that not all results are received at the same time and often the doctors may need to interpret multiple results in order to provide you with the best plan of care or course of treatment. Therefore, we ask that you please give [place] 2 business days to thoroughly review all your results before contacting the office for clarification. Should we see a critical lab result, you will be contacted sooner.   If You Need Anything After Your Visit  If you have any questions or concerns for your  doctor, please call our main line at [phone] and press option 4 to reach your doctor's medical assistant. If no one answers, please leave a voicemail as directed and we will return your call as soon as possible. Messages left after 4 pm will be answered the following business day.   You may also send [place] a message via [place]. We typically respond to MyChart messages within 1-2 business days.  For prescription refills, please ask your pharmacy to contact our office. Our fax number is [phone].  If you have an urgent issue when the clinic is closed that cannot wait until the next business day, you can page your doctor at the number below.    Please note that while we do our best to be available for urgent issues outside of office hours, we are not available 24/7.   If you have an urgent issue and are unable to reach [place], you may choose to seek medical care at your doctor's office, retail clinic, urgent care center, or emergency room.  If you have a medical emergency, please immediately call 911 or go to the emergency department.  Pager Numbers  - Dr. Nehemiah Massed: [phone]  - Dr. Laurence Ferrari: [phone]  - Dr. Nicole Kindred: [phone]  In the event of inclement weather, please call our main line at [phone] for an update on the status  of any delays or closures.  Dermatology Medication Tips: Please keep the boxes that topical medications come in in order to help keep track of the instructions about where and how to use these. Pharmacies typically print the medication instructions only on the boxes and not directly on the medication tubes.   If your medication is too expensive, please contact our office at [phone] option 4 or send [place] a message through [place].   We are unable to tell what your co-pay for medications will be in advance as this is different depending on your insurance coverage. However, we may be able to find a substitute medication at lower cost or fill out paperwork  to get insurance to cover a needed medication.   If a prior authorization is required to get your medication covered by your insurance company, please allow [place] 1-2 business days to complete this process.  Drug prices often vary depending on where the prescription is filled and some pharmacies may offer cheaper prices.  The website www.goodrx.com contains coupons for medications through different pharmacies. The prices here do not account for what the cost may be with help from insurance (it may be cheaper with your insurance), but the website can give you the price if you did not use any insurance.  - You can print the associated coupon and take it with your prescription to the pharmacy.  - You may also stop by our office during regular business hours and pick up a GoodRx coupon card.  - If you need your prescription sent electronically to a different pharmacy, notify our office through [facility] or by phone at [phone] option 4.     Si Usted Necesita Algo Despus de Su Visita  Tambin puede enviarnos un mensaje a travs de Pharmacist, community. Por lo general respondemos a los mensajes de MyChart en el transcurso de 1 a 2 das hbiles.  Para renovar recetas, por favor pida a su farmacia que se ponga en contacto con nuestra oficina. Harland Dingwall de fax es [place] [phone].  Si tiene un asunto urgente cuando la clnica est cerrada y que no puede esperar hasta el siguiente da hbil, puede llamar/localizar a su doctor(a) al nmero que aparece a continuacin.   Por favor, tenga en cuenta que aunque hacemos todo lo posible para estar disponibles para asuntos urgentes fuera del horario de [place], no estamos disponibles las 24 horas del da, los 7 das de la [place].   Si tiene un problema urgente y no puede comunicarse con nosotros, puede optar por buscar atencin mdica  en el consultorio de su doctor(a), en una clnica privada, en un centro de atencin urgente o en una sala de  emergencias.  Si tiene Engineering geologist, por favor llame inmediatamente al 911 o vaya a la sala de emergencias.  Nmeros de bper  - Dr. Nehemiah Massed: [phone]  - Dra. Moye: [phone]  - Dra. Nicole Kindred: [phone]  En caso de inclemencias del [place], por favor llame a Johnsie Kindred principal al [phone] para una actualizacin sobre el [place] de cualquier retraso o cierre.  Consejos para la medicacin en dermatologa: Por favor, guarde las cajas en las que vienen los medicamentos de uso tpico para ayudarle a seguir las instrucciones sobre dnde y cmo usarlos. Las farmacias generalmente imprimen las instrucciones del medicamento slo en las cajas y no directamente en los tubos del [place].   Si su medicamento es muy caro, por favor, pngase en contacto con Zigmund Daniel llamando al [phone] y presione  la opcin 4 o envenos un mensaje a travs de MyChart.   No podemos decirle cul ser su copago por los medicamentos por adelantado ya que esto es diferente dependiendo de la cobertura de su seguro. Sin embargo, es posible que podamos encontrar un medicamento sustituto a Electrical engineer un formulario para que el seguro cubra el medicamento que se considera necesario.   Si se requiere una autorizacin previa para que su compaa de seguros [place] su medicamento, por favor permtanos de 1 a 2 das hbiles para completar este proceso.  Los precios de los medicamentos varan con frecuencia dependiendo del Environmental consultant de dnde se surte la receta y alguna farmacias pueden ofrecer precios ms baratos.  El sitio web www.goodrx.com tiene cupones para medicamentos de Airline pilot. Los precios aqu no tienen en cuenta lo que podra costar con la ayuda del seguro (puede ser ms barato con su seguro), pero el sitio web puede darle el precio si no utiliz Research scientist (physical sciences).  - Puede imprimir el cupn correspondiente y llevarlo con su receta a la farmacia.  - Tambin puede pasar por  nuestra oficina durante el horario de atencin regular y Charity fundraiser una tarjeta de cupones de GoodRx.  - Si necesita que su receta se enve electrnicamente a una farmacia diferente, informe a nuestra oficina a travs de MyChart de [place] o por telfono llamando al [phone] y presione la opcin 4.

## 2022-12-23 ENCOUNTER — Encounter: Payer: Self-pay | Admitting: Dermatology

## 2023-04-06 ENCOUNTER — Encounter: Payer: 59 | Admitting: Family Medicine

## 2023-05-11 ENCOUNTER — Telehealth: Payer: Self-pay | Admitting: Family Medicine

## 2023-05-11 ENCOUNTER — Other Ambulatory Visit: Payer: Self-pay | Admitting: Family Medicine

## 2023-05-11 DIAGNOSIS — E538 Deficiency of other specified B group vitamins: Secondary | ICD-10-CM

## 2023-05-11 DIAGNOSIS — E7800 Pure hypercholesterolemia, unspecified: Secondary | ICD-10-CM

## 2023-05-11 DIAGNOSIS — Z125 Encounter for screening for malignant neoplasm of prostate: Secondary | ICD-10-CM

## 2023-05-11 DIAGNOSIS — Z131 Encounter for screening for diabetes mellitus: Secondary | ICD-10-CM

## 2023-05-11 DIAGNOSIS — Z79899 Other long term (current) drug therapy: Secondary | ICD-10-CM

## 2023-05-11 DIAGNOSIS — E559 Vitamin D deficiency, unspecified: Secondary | ICD-10-CM

## 2023-05-11 NOTE — Telephone Encounter (Signed)
Copied from CRM [id number]. Topic: General - Other >> May 11, 2023  1:09 PM Franchot Heidelberg wrote: Reason for CRM: Pt called reporting that he wants to have lab work prior to his CPE appt next week.

## 2023-05-11 NOTE — Telephone Encounter (Signed)
Patient called and stated he would be by tomorrow to pick his lab orders up.

## 2023-05-20 LAB — COMPREHENSIVE METABOLIC PANEL
Alkaline Phosphatase: 63 IU/L (ref 44–121)
eGFR: 91 mL/min/{1.73_m2} (ref >59)

## 2023-05-20 LAB — CBC WITH DIFFERENTIAL/PLATELET: Basos: 1 %

## 2023-06-02 ENCOUNTER — Encounter: Payer: 59 | Admitting: Family Medicine

## 2023-06-25 ENCOUNTER — Ambulatory Visit: Payer: 59 | Admitting: Dermatology

## 2023-07-09 ENCOUNTER — Ambulatory Visit: Payer: 59 | Admitting: Dermatology

## 2023-07-27 ENCOUNTER — Ambulatory Visit: Payer: 59 | Admitting: Dermatology

## 2023-07-27 ENCOUNTER — Encounter: Payer: Self-pay | Admitting: Dermatology

## 2023-07-27 DIAGNOSIS — W908XXA Exposure to other nonionizing radiation, initial encounter: Secondary | ICD-10-CM | POA: Diagnosis not present

## 2023-07-27 DIAGNOSIS — L578 Other skin changes due to chronic exposure to nonionizing radiation: Secondary | ICD-10-CM | POA: Diagnosis not present

## 2023-07-27 DIAGNOSIS — L570 Actinic keratosis: Secondary | ICD-10-CM | POA: Diagnosis not present

## 2023-07-27 DIAGNOSIS — Z7189 Other specified counseling: Secondary | ICD-10-CM

## 2023-07-27 DIAGNOSIS — D692 Other nonthrombocytopenic purpura: Secondary | ICD-10-CM | POA: Diagnosis not present

## 2023-07-27 DIAGNOSIS — L821 Other seborrheic keratosis: Secondary | ICD-10-CM

## 2023-07-27 DIAGNOSIS — Z5111 Encounter for antineoplastic chemotherapy: Secondary | ICD-10-CM

## 2023-07-27 DIAGNOSIS — Z79899 Other long term (current) drug therapy: Secondary | ICD-10-CM

## 2023-07-27 NOTE — Progress Notes (Signed)
Follow-Up Visit   Subjective  James Newman is a 72 y.o. male who presents for the following: Actinic keratosis of the face. He treated with 5FU/Calcipotriene to the left cheek after last visit, May 2024. He has a good reaction. The patient has spots, moles and lesions to be evaluated, some may be new or changing and the patient may have concern these could be cancer.   The following portions of the chart were reviewed this encounter and updated as appropriate: medications, allergies, medical history  Review of Systems:  No other skin or systemic complaints except as noted in HPI or Assessment and Plan.  Objective  Well appearing patient in no apparent distress; mood and affect are within normal limits.  A focused examination was performed of the following areas: Face, arms  Relevant exam findings are noted in the Assessment and Plan.  face Early pink scaly macules on the face.     Assessment & Plan   AK (actinic keratosis) face  Early Aks not treated today. Will be treated with topical 5FU/Calcipotriene cream. Recheck on follow-up.   Actinic keratoses are precancerous spots that appear secondary to cumulative UV radiation exposure/sun exposure over time. They are chronic with expected duration over 1 year. A portion of actinic keratoses will progress to squamous cell carcinoma of the skin. It is not possible to reliably predict which spots will progress to skin cancer and so treatment is recommended to prevent development of skin cancer.  Recommend daily broad spectrum sunscreen SPF 30+ to sun-exposed areas, reapply every 2 hours as needed.  Recommend staying in the shade or wearing long sleeves, sun glasses (UVA+UVB protection) and wide brim hats (4-inch brim around the entire circumference of the hat). Call for new or changing lesions.  ACTINIC DAMAGE WITH PRECANCEROUS ACTINIC KERATOSES Counseling for Topical Chemotherapy Management: Patient exhibits: - Severe, confluent  actinic changes with pre-cancerous actinic keratoses that is secondary to cumulative UV radiation exposure over time - Condition that is severe; chronic, not at goal. - diffuse scaly erythematous macules and papules with underlying dyspigmentation - Discussed Prescription "Field Treatment" topical Chemotherapy for Severe, Chronic Confluent Actinic Changes with Pre-Cancerous Actinic Keratoses Field treatment involves treatment of an entire area of skin that has confluent Actinic Changes (Sun/ Ultraviolet light damage) and PreCancerous Actinic Keratoses by method of PhotoDynamic Therapy (PDT) and/or prescription Topical Chemotherapy agents such as 5-fluorouracil, 5-fluorouracil/calcipotriene, and/or imiquimod.  The purpose is to decrease the number of clinically evident and subclinical PreCancerous lesions to prevent progression to development of skin cancer by chemically destroying early precancer changes that may or may not be visible.  It has been shown to reduce the risk of developing skin cancer in the treated area. As a result of treatment, redness, scaling, crusting, and open sores may occur during treatment course. One or more than one of these methods may be used and may have to be used several times to control, suppress and eliminate the PreCancerous changes. Discussed treatment course, expected reaction, and possible side effects. - Recommend daily broad spectrum sunscreen SPF 30+ to sun-exposed areas, reapply every 2 hours as needed.  - Staying in the shade or wearing long sleeves, sun glasses (UVA+UVB protection) and wide brim hats (4-inch brim around the entire circumference of the hat) are also recommended. - Call for new or changing lesions. - Start 5-fluorouracil/calcipotriene cream twice a day for 4 days to left and right temples, wait 2 weeks and then start cream twice a day for 4 days to  forehead, wait 2 weeks and then use cream on right cheek twice daily for 4 days.  Patient provided with  handout reviewing treatment course and side effects and advised to call or message [place] on MyChart with any concerns.    Purpura - Chronic; persistent and recurrent.  Treatable, but not curable. - Violaceous macules and patches - Benign - Related to trauma, age, sun damage and/or use of blood thinners, chronic use of topical and/or oral steroids - Observe - Can use OTC arnica containing moisturizer such as Dermend Bruise Formula if desired - Call for worsening or other concerns  SEBORRHEIC KERATOSIS - Stuck-on, waxy, tan-brown papules and/or plaques  - Benign-appearing - Discussed benign etiology and prognosis. - Observe - Call for any changes  Return in about 8 months (around 03/26/2024) for AKs.  Wendee Beavers, CMA, am acting as scribe for Armida Sans, MD .   Documentation: I have reviewed the above documentation for accuracy and completeness, and I agree with the above.  Armida Sans, MD

## 2023-07-27 NOTE — Patient Instructions (Signed)
-   Start 5-fluorouracil/calcipotriene cream twice a day for 4 days to left and right temples, wait 2 weeks and then start cream twice a day for 4 days to forehead, wait 2 weeks and then use cream on right cheek twice daily for 4 days.  Patient provided with handout reviewing treatment course and side effects and advised to call or message [place] on MyChart with any concerns.  Reviewed course of treatment and expected reaction.  Patient advised to expect inflammation and crusting and advised that erosions are possible.  Patient advised to be diligent with sun protection during and after treatment. Counseled to keep medication out of reach of children and pets.  5-Fluorouracil/Calcipotriene Patient Education   Actinic keratoses are the dry, red scaly spots on the skin caused by sun damage. A portion of these spots can turn into skin cancer with time, and treating them can help prevent development of skin cancer.   Treatment of these spots requires removal of the defective skin cells. There are various ways to remove actinic keratoses, including freezing with liquid nitrogen, treatment with creams, or treatment with a blue light procedure in the office.   5-fluorouracil cream is a topical cream used to treat actinic keratoses. It works by interfering with the growth of abnormal fast-growing skin cells, such as actinic keratoses. These cells peel off and are replaced by healthy ones.   5-fluorouracil/calcipotriene is a combination of the 5-fluorouracil cream with a vitamin D analog cream called calcipotriene. The calcipotriene alone does not treat actinic keratoses. However, when it is combined with 5-fluorouracil, it helps the 5-fluorouracil treat the actinic keratoses much faster so that the same results can be achieved with a much shorter treatment time.  INSTRUCTIONS FOR 5-FLUOROURACIL/CALCIPOTRIENE CREAM:   5-fluorouracil/calcipotriene cream typically only needs to be used for 4-7 days. A thin layer  should be applied twice a day to the treatment areas recommended by your physician.   If your physician prescribed you separate tubes of 5-fluourouracil and calcipotriene, apply a thin layer of 5-fluorouracil followed by a thin layer of calcipotriene.   Avoid contact with your eyes, nostrils, and mouth. Do not use 5-fluorouracil/calcipotriene cream on infected or open wounds.   You will develop redness, irritation and some crusting at areas where you have pre-cancer damage/actinic keratoses. IF YOU DEVELOP PAIN, BLEEDING, OR SIGNIFICANT CRUSTING, STOP THE TREATMENT EARLY - you have already gotten a good response and the actinic keratoses should clear up well.  Wash your hands after applying 5-fluorouracil 5% cream on your skin.   A moisturizer or sunscreen with a minimum SPF 30 should be applied each morning.   Once you have finished the treatment, you can apply a thin layer of Vaseline twice a day to irritated areas to soothe and calm the areas more quickly. If you experience significant discomfort, contact your physician.  For some patients it is necessary to repeat the treatment for best results.  SIDE EFFECTS: When using 5-fluorouracil/calcipotriene cream, you may have mild irritation, such as redness, dryness, swelling, or a mild burning sensation. This usually resolves within 2 weeks. The more actinic keratoses you have, the more redness and inflammation you can expect during treatment. Eye irritation has been reported rarely. If this occurs, please let us know.  If you have any trouble using this cream, please call the office. If you have any other questions about this information, please do not hesitate to ask me before you leave the office.

## 2023-08-08 ENCOUNTER — Encounter: Payer: Self-pay | Admitting: Dermatology

## 2023-08-25 NOTE — Progress Notes (Signed)
Name: James Newman   MRN: [id number]    DOB: 03-Jan-1951   Date:09/02/2023       Progress Note  Subjective  Chief Complaint  Chief Complaint  Patient presents with   Annual Exam    HPI  Patient presents for annual CPE .  IPSS Questionnaire (AUA-7): Over the past month.   1)  How often have you had a sensation of not emptying your bladder completely after you finish urinating?  0 - Not at all  2)  How often have you had to urinate again less than two hours after you finished urinating? 1 - Less than 1 time in 5  3)  How often have you found you stopped and started again several times when you urinated?  0 - Not at all  4) How difficult have you found it to postpone urination?  0 - Not at all  5) How often have you had a weak urinary stream?  0 - Not at all  6) How often have you had to push or strain to begin urination?  0 - Not at all  7) How many times did you most typically get up to urinate from the time you went to bed until the time you got up in the morning?  0 - None  Total score:  0-7 mildly symptomatic   8-19 moderately symptomatic   20-35 severely symptomatic     Diet: follows a low carbohydrate diet, goes out with friends twice a week  Exercise: continue regular physical activity  Last Dental Exam: up to date  Last Eye Exam: up to date   Depression: phq 9 is negative    09/02/2023    8:45 AM 03/27/2022    8:30 AM 10/07/2021    8:25 AM 11/16/2020    7:46 AM 11/10/2019    9:15 AM  Depression screen PHQ 2/9  Decreased Interest 0 0 0 0 0  Down, Depressed, Hopeless 0 0 0 0 0  PHQ - 2 Score 0 0 0 0 0  Altered sleeping 0 0 0 0 0  Tired, decreased energy 0 0 0 0 0  Change in appetite 0 0 0 0 0  Feeling bad or failure about yourself  0 0 0 0 0  Trouble concentrating 0 0 0 0 0  Moving slowly or fidgety/restless 0 0 0 0 0  Suicidal thoughts 0 0 0 0 0  PHQ-9 Score 0 0 0 0 0  Difficult doing work/chores Not difficult at all  Not difficult at all  Not difficult at all     Hypertension:  BP Readings from Last 3 Encounters:  09/02/23 118/78  12/22/22 (!) 142/91  03/27/22 122/72    Obesity: Wt Readings from Last 3 Encounters:  09/02/23 223 lb 4.8 oz (101.3 kg)  03/27/22 216 lb (98 kg)  10/07/21 216 lb 6.4 oz (98.2 kg)   BMI Readings from Last 3 Encounters:  09/02/23 30.28 kg/m  03/27/22 29.29 kg/m  10/07/21 29.35 kg/m     Lipids:  Lab Results  Component Value Date   CHOL 159 05/14/2023   CHOL 146 11/26/2021   CHOL 153 07/05/2021   Lab Results  Component Value Date   HDL 48 05/14/2023   HDL 41 11/26/2021   HDL 39 (L) 07/05/2021   Lab Results  Component Value Date   LDLCALC 94 05/14/2023   LDLCALC 86 11/26/2021   LDLCALC 86 07/05/2021   Lab Results  Component Value Date   TRIG 89  05/14/2023   TRIG 100 11/26/2021   TRIG 158 (H) 07/05/2021   Lab Results  Component Value Date   CHOLHDL 3.3 05/14/2023   CHOLHDL 3.6 11/26/2021   CHOLHDL 3.9 07/05/2021   No results found for: "LDLDIRECT" Glucose:  Glucose  Date Value Ref Range Status  05/14/2023 102 (H) 70 - 99 mg/dL Final  41/32/4401 99 70 - 99 mg/dL Final  02/72/5366 94 65 - 99 mg/dL Final   Glucose, Bld  Date Value Ref Range Status  02/13/2016 108 (H) 65 - 99 mg/dL Final  44/11/4740 595 (H) 65 - 99 mg/dL Final  63/87/5643 329 (H) 65 - 99 mg/dL Final    Flowsheet Row Office Visit from 09/02/2023 in [facility]  AUDIT-C Score 2         Married STD testing and prevention (HIV/chl/gon/syphilis):  not applicable Sexual history: married, one partner - not sexually active in a while  Hep C Screening: Complete Skin cancer: sees dermatologist  Colorectal cancer: cologuard: due/ordered Prostate cancer:  discussed USPF 05/14/23: 1.7  Lung cancer:  not a candidate  AAA: not a candidate  ECG:  05/2023 at cardiologist office   Vaccines:   RSV: refused  Tdap: due - refused  Shingrix: due - refused  Pneumonia: due - refused  Flu: at  work  COVID-19:refused  Advanced Care Planning: A voluntary discussion about advance care planning including the explanation and discussion of advance directives.  Discussed health care proxy and Living will, and the patient was able to identify a health care proxy as wife .  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   Obesity (BMI 30-39.9) 03/14/2015   Vitamin D deficiency 03/14/2015   Chronic a-fib (HCC) 03/08/2007    Past Surgical History:  Procedure Laterality Date   NASAL SEPTUM SURGERY      Family History  Problem Relation Age of Onset   COPD Mother    Hypertension Son    Obesity Son     Social History   Socioeconomic History   Marital status: Married    Spouse name: Liborio Nixon   Number of children: 1   Years of education: Not on file   Highest education level: 12th grade  Occupational History   Not on file  Tobacco Use   Smoking status: Never   Smokeless tobacco: Never  Vaping Use   Vaping status: Never Used  Substance and Sexual Activity   Alcohol use: Yes    Alcohol/week: 2.0 standard drinks of alcohol    Types: 2 Standard drinks or equivalent per week   Drug use: No   Sexual activity: Not Currently    Partners: Female  Other Topics Concern   Not on file  Social History Narrative   Not on file   Social Determinants of Health   Financial Resource Strain: Medium Risk (08/31/2023)   Overall Financial Resource  Strain (CARDIA)    Difficulty of Paying Living Expenses: Somewhat hard  Food Insecurity:  No Food Insecurity (08/31/2023)   Hunger Vital Sign    Worried About Running Out of Food in the Last Year: Never true    Ran Out of Food in the Last Year: Never true  Transportation Needs: No Transportation Needs (08/31/2023)   PRAPARE - Administrator, Civil Service (Medical): No    Lack of Transportation (Non-Medical): No  Physical Activity: Sufficiently Active (08/31/2023)   Exercise Vital Sign    Days of Exercise per Week: 5 days    Minutes of Exercise per Session: 120 min  Stress: No Stress Concern Present (08/31/2023)   [employer] of Occupational Health - Occupational Stress Questionnaire    Feeling of Stress : Not at all  Social Connections: Socially Integrated (08/31/2023)   Social Connection and Isolation Panel [NHANES]    Frequency of Communication with Friends and Family: More than three times a week    Frequency of Social Gatherings with Friends and Family: More than three times a week    Attends Religious Services: More than 4 times per year    Active Member of [employer] or Organizations: Yes    Attends Engineer, structural: More than 4 times per year    Marital Status: Married  Catering manager Violence: Not At Risk (09/02/2023)   Humiliation, Afraid, Rape, and Kick questionnaire    Fear of Current or Ex-Partner: No    Emotionally Abused: No    Physically Abused: No    Sexually Abused: No     Current Outpatient Medications:    cholecalciferol (VITAMIN D) 1000 units tablet, Take 1,000 Units by mouth daily., Disp: , Rfl:    clindamycin (CLINDAGEL) 1 % gel, Apply topically 2 (two) times daily. On face rash, Disp: 30 g, Rfl: 0   Clotrimazole 1 % OINT, Apply 1 each topically daily. Umbilicus, Disp: 56.7 g, Rfl: 1   diclofenac Sodium (VOLTAREN) 1 % GEL, Apply 2 g topically 4 (four) times daily., Disp: 100 g, Rfl: 0   diltiazem (CARDIZEM CD) 240 MG 24 hr capsule,  Take 1 capsule (240 mg total) by mouth daily., Disp: 30 capsule, Rfl: 5   furosemide (LASIX) 40 MG tablet, Take 1 tablet (40 mg total) by mouth daily., Disp: 30 tablet, Rfl: 5   lisinopril (PRINIVIL,ZESTRIL) 5 MG tablet, Take 1 tablet (5 mg total) by mouth daily., Disp: 30 tablet, Rfl: 5   metoprolol succinate (TOPROL-XL) 50 MG 24 hr tablet, Take 1 tablet (50 mg total) by mouth daily. Take with or immediately following a meal., Disp: 30 tablet, Rfl: 5   rosuvastatin (CRESTOR) 10 MG tablet, Take 1 tablet (10 mg total) by mouth daily., Disp: 90 tablet, Rfl: 1   vitamin B-12 (CYANOCOBALAMIN) 1000 MCG tablet, Take 1,000 mcg by mouth daily., Disp: , Rfl:    warfarin (COUMADIN) 5 MG tablet, Take 1 tablet (5 mg total) by mouth daily. Take one tablet by mouth daily, Disp: 45 tablet, Rfl: 5   zinc gluconate 50 MG tablet, Take 1 tablet by mouth daily., Disp: , Rfl:   Allergies  Allergen Reactions   Diltiazem Cough   Lisinopril Cough     ROS  Constitutional: Negative for fever or weight change.  Respiratory: Negative for cough and shortness of breath.   Cardiovascular: Negative for chest pain or palpitations.  Gastrointestinal: Negative for abdominal pain, no bowel changes.  Musculoskeletal: Negative for gait problem or joint swelling.  Skin: Negative for rash.  Neurological: Negative for dizziness or headache.  No other specific  complaints in a complete review of systems (except as listed in HPI above).    Objective  Vitals:   09/02/23 0849  BP: 118/78  Pulse: 95  Resp: 16  SpO2: 95%  Weight: 223 lb 4.8 oz (101.3 kg)  Height: 6' (1.829 m)    Body mass index is 30.28 kg/m.  Physical Exam  Constitutional: Patient appears well-developed and well-nourished. No distress.  HENT: Head: Normocephalic and atraumatic. Ears: B TMs ok, no erythema or effusion; Nose: Nose normal. Mouth/Throat: Oropharynx is clear and moist. No oropharyngeal exudate.  Eyes: Conjunctivae and EOM are normal.  Pupils are equal, round, and reactive to light. No scleral icterus.  Neck: Normal range of motion. Neck supple. No JVD present. No thyromegaly present.  Cardiovascular: Normal rate, irregular rhythm No murmur heard. No BLE edema. Pulmonary/Chest: Effort normal and breath sounds normal. No respiratory distress. Abdominal: Soft. Bowel sounds are normal, no distension. There is no tenderness. no masses MALE GENITALIA: Normal descended testes bilaterally, no masses palpated, no hernias, no lesions, no discharge RECTAL: not done  Musculoskeletal: Normal range of motion, no joint effusions. No gross deformities Neurological: he is alert and oriented to person, place, and time. No cranial nerve deficit. Coordination, balance, strength, speech and gait are normal.  Skin: Skin is warm and dry. No rash noted. senile purpura  Psychiatric: Patient has a normal mood and affect. behavior is normal. Judgment and thought content normal.    Fall Risk:    09/02/2023    8:45 AM 03/27/2022    8:30 AM 10/07/2021    8:25 AM 11/16/2020    7:46 AM 11/10/2019    9:15 AM  Fall Risk   Falls in the past year? 0 0 0 0 0  Number falls in past yr: 0 0 0 0 0  Injury with Fall? 0 0 0 0 0  Risk for fall due to : No Fall Risks No Fall Risks No Fall Risks    Follow up Falls prevention discussed;Education provided;Falls evaluation completed Falls prevention discussed Falls prevention discussed  Falls evaluation completed     Functional Status Survey: Is the patient deaf or have difficulty hearing?: No Does the patient have difficulty seeing, even when wearing glasses/contacts?: No Does the patient have difficulty concentrating, remembering, or making decisions?: No Does the patient have difficulty walking or climbing stairs?: No Does the patient have difficulty dressing or bathing?: No Does the patient have difficulty doing errands alone such as visiting a doctor's office or shopping?: No    Assessment & Plan  1.  Well adult exam   2. Colon cancer screening  He will call me with the doctor in [place]   -Prostate cancer screening and PSA options (with potential risks and benefits of testing vs not testing) were discussed along with recent recs/guidelines. -USPSTF grade A and B recommendations reviewed with patient; age-appropriate recommendations, preventive care, screening tests, etc discussed and encouraged; healthy living encouraged; see AVS for patient education given to patient -Discussed importance of 150 minutes of physical activity weekly, eat two servings of fish weekly, eat one serving of tree nuts ( cashews, pistachios, pecans, almonds.Marland Kitchen) every other day, eat 6 servings of fruit/vegetables daily and drink plenty of water and avoid sweet beverages.  -Reviewed Health Maintenance: yes

## 2023-09-02 ENCOUNTER — Ambulatory Visit (INDEPENDENT_AMBULATORY_CARE_PROVIDER_SITE_OTHER): Payer: 59 | Admitting: Family Medicine

## 2023-09-02 ENCOUNTER — Encounter: Payer: Self-pay | Admitting: Family Medicine

## 2023-09-02 VITALS — BP 118/78 | HR 95 | Resp 16 | Ht 72.0 in | Wt 223.3 lb

## 2023-09-02 DIAGNOSIS — Z1211 Encounter for screening for malignant neoplasm of colon: Secondary | ICD-10-CM | POA: Diagnosis not present

## 2023-09-02 DIAGNOSIS — Z0000 Encounter for general adult medical examination without abnormal findings: Secondary | ICD-10-CM

## 2023-09-02 DIAGNOSIS — Z23 Encounter for immunization: Secondary | ICD-10-CM

## 2024-03-02 ENCOUNTER — Ambulatory Visit: Payer: Self-pay | Admitting: Family Medicine

## 2024-03-22 ENCOUNTER — Ambulatory Visit: Payer: 59 | Admitting: Dermatology

## 2024-04-12 ENCOUNTER — Ambulatory Visit: Admitting: Family Medicine

## 2024-04-12 ENCOUNTER — Encounter: Payer: Self-pay | Admitting: Family Medicine

## 2024-04-12 VITALS — BP 118/76 | HR 92 | Resp 16 | Ht 72.0 in | Wt 229.8 lb

## 2024-04-12 DIAGNOSIS — I5022 Chronic systolic (congestive) heart failure: Secondary | ICD-10-CM

## 2024-04-12 DIAGNOSIS — Z131 Encounter for screening for diabetes mellitus: Secondary | ICD-10-CM

## 2024-04-12 DIAGNOSIS — Z125 Encounter for screening for malignant neoplasm of prostate: Secondary | ICD-10-CM

## 2024-04-12 DIAGNOSIS — I482 Chronic atrial fibrillation, unspecified: Secondary | ICD-10-CM | POA: Diagnosis not present

## 2024-04-12 DIAGNOSIS — E538 Deficiency of other specified B group vitamins: Secondary | ICD-10-CM

## 2024-04-12 DIAGNOSIS — L719 Rosacea, unspecified: Secondary | ICD-10-CM

## 2024-04-12 DIAGNOSIS — E7800 Pure hypercholesterolemia, unspecified: Secondary | ICD-10-CM

## 2024-04-12 DIAGNOSIS — R739 Hyperglycemia, unspecified: Secondary | ICD-10-CM

## 2024-04-12 DIAGNOSIS — E559 Vitamin D deficiency, unspecified: Secondary | ICD-10-CM | POA: Diagnosis not present

## 2024-04-12 DIAGNOSIS — Z79899 Other long term (current) drug therapy: Secondary | ICD-10-CM

## 2024-04-12 DIAGNOSIS — F338 Other recurrent depressive disorders: Secondary | ICD-10-CM

## 2024-04-12 DIAGNOSIS — I872 Venous insufficiency (chronic) (peripheral): Secondary | ICD-10-CM

## 2024-04-12 NOTE — Progress Notes (Unsigned)
 Name: James Newman   MRN: [id number]    DOB: June 23, 1951   Date:04/12/2024       Progress Note  Subjective  Chief Complaint  Chief Complaint  Patient presents with   Medical Management of Chronic Issues   {HPI:32069} *** Patient Active Problem List   Diagnosis Date Noted   Bradycardia 10/30/2020   Moderate mitral insufficiency 11/03/2016   Chronic systolic (congestive) heart failure (HCC) 03/20/2016   Seasonal affective disorder (HCC) 11/22/2015   Purpura senilis (HCC) 11/22/2015   Encounter for therapeutic drug level monitoring 03/14/2015   Atypical nevus 03/14/2015   B12 deficiency 03/14/2015   Dyslipidemia 03/14/2015   Tension headache 03/14/2015   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   Obesity (BMI 30-39.9) 03/14/2015   Vitamin D  deficiency 03/14/2015   Chronic a-fib (HCC) 03/08/2007    Past Surgical History:  Procedure Laterality Date   NASAL SEPTUM SURGERY      Family History  Problem Relation Age of Onset   COPD Mother    Hypertension Son    Obesity Son     Social History   Tobacco Use   Smoking status: Never   Smokeless tobacco: Never  Substance Use Topics   Alcohol use: Yes    Alcohol/week: 2.0 standard drinks of alcohol    Types: 2 Standard drinks or equivalent per week     Current Outpatient Medications:    furosemide  (LASIX ) 40 MG tablet, Take 1 tablet (40 mg total) by mouth daily., Disp: 30 tablet, Rfl: 5   metoprolol  succinate (TOPROL -XL) 50 MG 24 hr tablet, Take 1 tablet (50 mg total) by mouth daily. Take with or immediately following a meal., Disp: 30 tablet, Rfl: 5   rosuvastatin  (CRESTOR ) 5 MG tablet, Take 5 mg by mouth daily., Disp: , Rfl:    warfarin (COUMADIN ) 5 MG tablet, Take 1 tablet (5 mg total) by mouth daily. Take one tablet by mouth daily, Disp: 45 tablet, Rfl: 5   cholecalciferol (VITAMIN D ) 1000 units tablet, Take 1,000 Units by mouth daily. (Patient not taking: Reported on 04/12/2024), Disp:  , Rfl:    vitamin B-12 (CYANOCOBALAMIN ) 1000 MCG tablet, Take 1,000 mcg by mouth daily. (Patient not taking: Reported on 04/12/2024), Disp: , Rfl:   Allergies  Allergen Reactions   Diltiazem  Cough   Lisinopril  Cough    I personally reviewed active problem list, medication list, allergies with the patient/caregiver today.   ROS  Ten systems reviewed and is negative except as mentioned in HPI    Objective Physical Exam   Vitals:   04/12/24 1520  BP: 118/76  Pulse: 92  Resp: 16  SpO2: 97%  Weight: 229 lb 12.8 oz (104.2 kg)  Height: 6' (1.829 m)    Body mass index is 31.17 kg/m.    PHQ2/9:    04/12/2024    3:14 PM 09/02/2023    8:45 AM 03/27/2022    8:30 AM 10/07/2021    8:25 AM 11/16/2020    7:46 AM  Depression screen PHQ 2/9  Decreased Interest 0 0 0 0 0  Down, Depressed, Hopeless 0 0 0 0 0  PHQ - 2 Score 0 0 0 0 0  Altered sleeping  0 0 0 0  Tired, decreased energy  0 0 0 0  Change in appetite  0 0 0 0  Feeling bad or failure about yourself   0 0 0 0  Trouble concentrating  0 0 0 0  Moving slowly or  fidgety/restless  0 0 0 0  Suicidal thoughts  0 0 0 0  PHQ-9 Score  0 0 0 0  Difficult doing work/chores  Not difficult at all  Not difficult at all     phq 9 is negative  Fall Risk:    04/12/2024    3:14 PM 09/02/2023    8:45 AM 03/27/2022    8:30 AM 10/07/2021    8:25 AM 11/16/2020    7:46 AM  Fall Risk   Falls in the past year? 0 0 0 0 0  Number falls in past yr: 0 0 0 0 0  Injury with Fall? 0 0 0 0 0  Risk for fall due to : No Fall Risks No Fall Risks No Fall Risks No Fall Risks   Follow up Falls evaluation completed Falls prevention discussed;Education provided;Falls evaluation completed Falls prevention discussed  Falls prevention discussed       Data saved with a previous flowsheet row definition     {A&P:32071}

## 2024-04-13 DIAGNOSIS — E7800 Pure hypercholesterolemia, unspecified: Secondary | ICD-10-CM | POA: Insufficient documentation

## 2024-04-13 DIAGNOSIS — I872 Venous insufficiency (chronic) (peripheral): Secondary | ICD-10-CM | POA: Insufficient documentation

## 2024-04-27 ENCOUNTER — Ambulatory Visit: Admitting: Dermatology

## 2024-04-27 ENCOUNTER — Encounter: Payer: Self-pay | Admitting: Dermatology

## 2024-04-27 DIAGNOSIS — L570 Actinic keratosis: Secondary | ICD-10-CM | POA: Diagnosis not present

## 2024-04-27 DIAGNOSIS — D692 Other nonthrombocytopenic purpura: Secondary | ICD-10-CM | POA: Diagnosis not present

## 2024-04-27 DIAGNOSIS — W908XXA Exposure to other nonionizing radiation, initial encounter: Secondary | ICD-10-CM

## 2024-04-27 DIAGNOSIS — Z79899 Other long term (current) drug therapy: Secondary | ICD-10-CM

## 2024-04-27 DIAGNOSIS — Z5111 Encounter for antineoplastic chemotherapy: Secondary | ICD-10-CM

## 2024-04-27 DIAGNOSIS — Z7189 Other specified counseling: Secondary | ICD-10-CM | POA: Diagnosis not present

## 2024-04-27 DIAGNOSIS — L820 Inflamed seborrheic keratosis: Secondary | ICD-10-CM | POA: Diagnosis not present

## 2024-04-27 DIAGNOSIS — L578 Other skin changes due to chronic exposure to nonionizing radiation: Secondary | ICD-10-CM | POA: Diagnosis not present

## 2024-04-27 NOTE — Patient Instructions (Addendum)
 Repeat 5FU/Calcipotriene treatment on temples twice a day for 1 week for precancerous lesions.      Cryotherapy Aftercare  Wash gently with soap and water everyday.   Apply Vaseline Jelly daily until healed.     Recommend daily broad spectrum sunscreen SPF 30+ to sun-exposed areas, reapply every 2 hours as needed. Call for new or changing lesions.  Staying in the shade or wearing long sleeves, sun glasses (UVA+UVB protection) and wide brim hats (4-inch brim around the entire circumference of the hat) are also recommended for sun protection.      Due to recent changes in healthcare laws, you may see results of your pathology and/or laboratory studies on MyChart before the doctors have had a chance to review them. We understand that in some cases there may be results that are confusing or concerning to you. Please understand that not all results are received at the same time and often the doctors may need to interpret multiple results in order to provide you with the best plan of care or course of treatment. Therefore, we ask that you please give us  2 business days to thoroughly review all your results before contacting the office for clarification. Should we see a critical lab result, you will be contacted sooner.   If You Need Anything After Your Visit  If you have any questions or concerns for your doctor, please call our main line at [phone] and press option 4 to reach your doctor's medical assistant. If no one answers, please leave a voicemail as directed and we will return your call as soon as possible. Messages left after 4 pm will be answered the following business day.   You may also send us  a message via MyChart. We typically respond to MyChart messages within 1-2 business days.  For prescription refills, please ask your pharmacy to contact our office. Our fax number is [phone].  If you have an urgent issue when the clinic is closed that cannot wait until the next  business day, you can page your doctor at the number below.    Please note that while we do our best to be available for urgent issues outside of office hours, we are not available 24/7.   If you have an urgent issue and are unable to reach us , you may choose to seek medical care at your doctor's office, retail clinic, urgent care center, or emergency room.  If you have a medical emergency, please immediately call 911 or go to the emergency department.  Pager Numbers  - Dr. Hester: [phone]  - Dr. Jackquline: [phone]  - Dr. Claudene: [phone]   In the event of inclement weather, please call our main line at [phone] for an update on the status of any delays or closures.  Dermatology Medication Tips: Please keep the boxes that topical medications come in in order to help keep track of the instructions about where and how to use these. Pharmacies typically print the medication instructions only on the boxes and not directly on the medication tubes.   If your medication is too expensive, please contact our office at [phone] option 4 or send us  a message through MyChart.   We are unable to tell what your co-pay for medications will be in advance as this is different depending on your insurance coverage. However, we may be able to find a substitute medication at lower cost or fill out paperwork to get insurance to cover a needed medication.   If a prior authorization  is required to get your medication covered by your insurance company, please allow us  1-2 business days to complete this process.  Drug prices often vary depending on where the prescription is filled and some pharmacies may offer cheaper prices.  The website www.goodrx.com contains coupons for medications through different pharmacies. The prices here do not account for what the cost may be with help from insurance (it may be cheaper with your insurance), but the website can give you the price if you did not use  any insurance.  - You can print the associated coupon and take it with your prescription to the pharmacy.  - You may also stop by our office during regular business hours and pick up a GoodRx coupon card.  - If you need your prescription sent electronically to a different pharmacy, notify our office through [facility] or by phone at [phone] option 4.     Si Usted Necesita Algo Despus de Su Visita  Tambin puede enviarnos un mensaje a travs de Clinical cytogeneticist. Por lo general respondemos a los mensajes de MyChart en el transcurso de 1 a 2 das hbiles.  Para renovar recetas, por favor pida a su farmacia que se ponga en contacto con nuestra oficina. Randi lakes de fax es [place] [phone].  Si tiene un asunto urgente cuando la clnica est cerrada y que no puede esperar hasta el siguiente da hbil, puede llamar/localizar a su doctor(a) al nmero que aparece a continuacin.   Por favor, tenga en cuenta que aunque hacemos todo lo posible para estar disponibles para asuntos urgentes fuera del horario de [place], no estamos disponibles las 24 horas del da, los 7 [address]  [address] de la [place].   Si tiene un problema urgente y no puede comunicarse con nosotros, puede optar por buscar atencin mdica  en el consultorio de su doctor(a), en una clnica privada, en un centro de atencin urgente o en una sala de emergencias.  Si tiene Engineer, drilling, por favor llame inmediatamente al 911 o vaya a la sala de emergencias.  Nmeros de bper  - Dr. Hester: [phone]  - Dra. Jackquline: [phone]  - Dr. Claudene: [phone]   En caso de inclemencias del tiempo, por favor llame a landry capes principal al [phone] para una actualizacin sobre el [place] de cualquier retraso o cierre.  Consejos para la medicacin en dermatologa: Por favor, guarde las cajas en las que vienen los medicamentos de uso tpico para ayudarle a seguir las instrucciones sobre dnde y cmo usarlos. Las farmacias  generalmente imprimen las instrucciones del medicamento slo en las cajas y no directamente en los tubos del [place].   Si su medicamento es muy caro, por favor, pngase en contacto con landry rieger llamando al [phone] y presione la opcin 4 o envenos un mensaje a travs de Clinical cytogeneticist.   No podemos decirle cul ser su copago por los medicamentos por adelantado ya que esto es diferente dependiendo de la cobertura de su seguro. Sin embargo, es posible que podamos encontrar un medicamento sustituto a Audiological scientist un formulario para que el seguro cubra el medicamento que se considera necesario.   Si se requiere una autorizacin previa para que su compaa de seguros malta su medicamento, por favor permtanos de 1 a 2 das hbiles para completar este proceso.  Los precios de los medicamentos varan con frecuencia dependiendo del Environmental consultant de dnde se surte la receta y alguna farmacias pueden ofrecer precios ms baratos.  El sitio web www.goodrx.com tiene cupones para  medicamentos de [employer]. Los precios aqu no tienen en cuenta lo que podra costar con la ayuda del seguro (puede ser ms barato con su seguro), pero el sitio web puede darle el precio si no utiliz Tourist information centre manager.  - Puede imprimir el cupn correspondiente y llevarlo con su receta a la farmacia.  - Tambin puede pasar por nuestra oficina durante el horario de atencin regular y Education officer, museum una tarjeta de cupones de GoodRx.  - Si necesita que su receta se enve electrnicamente a una farmacia diferente, informe a nuestra oficina a travs de MyChart de [place] o por telfono llamando al [phone] y presione la opcin 4.

## 2024-04-27 NOTE — Progress Notes (Unsigned)
 Follow-Up Visit   Subjective  James Newman is a 73 y.o. male who presents for the following: Actinic keratosis. 9 month follow up. Used 5FU/Calcipotriene as directed to face. Patient reports extensive reaction to treatment. States was inflamed and painful.   The patient has spots, moles and lesions to be evaluated, some may be new or changing and the patient may have concern these could be cancer.  The following portions of the chart were reviewed this encounter and updated as appropriate: medications, allergies, medical history  Review of Systems:  No other skin or systemic complaints except as noted in HPI or Assessment and Plan.  Objective  Well appearing patient in no apparent distress; mood and affect are within normal limits.  A focused examination was performed of the following areas: Face, arms, scalp, ears, neck  Relevant exam findings are noted in the Assessment and Plan.  R face x2 (2) Erythematous keratotic or waxy stuck-on papule or plaque. arms x23 (23) Erythematous thin papules/macules with gritty scale.   Assessment & Plan   Purpura - Chronic; persistent and recurrent.  Treatable, but not curable. - Violaceous macules and patches at arms - Benign - Related to trauma, age, sun damage and/or use of blood thinners, chronic use of topical and/or oral steroids - Observe - Can use OTC arnica containing moisturizer such as Dermend Bruise Formula if desired - Call for worsening or other concerns  INFLAMED SEBORRHEIC KERATOSIS (2) R face x2 (2) Symptomatic, irritating, patient would like treated. 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 They are chronic with expected duration over 1 year. A portion of actinic keratoses will progress to squamous cell carcinoma of the skin. It is not possible to reliably predict which spots will progress to skin cancer and so treatment is recommended to prevent development of skin cancer.  Recommend daily broad spectrum sunscreen SPF 30+ to sun-exposed areas, reapply every 2 hours as needed.  Recommend staying in the shade or wearing long sleeves, sun glasses (UVA+UVB protection) and wide brim hats (4-inch brim around the entire circumference of the hat). Call for new or changing lesions. Destruction of lesion - arms x23 (23) Complexity: simple   Destruction method: cryotherapy   Informed consent: discussed and consent obtained   Timeout:  patient name, date of birth, surgical site, and procedure verified Lesion destroyed using liquid nitrogen: Yes   Region frozen until ice ball extended beyond lesion: Yes   Outcome: patient tolerated procedure well with no complications   Post-procedure details: wound care instructions given   Additional details:  Prior to procedure, discussed risks of blister formation, small wound, skin dyspigmentation, or rare scar following cryotherapy. Recommend Vaseline ointment to treated areas while healing.   ACTINIC SKIN DAMAGE   CHEMOTHERAPY MANAGEMENT, ENCOUNTER FOR   COUNSELING AND COORDINATION OF CARE   MEDICATION MANAGEMENT   PURPURA (HCC)     ACTINIC DAMAGE WITH PRECANCEROUS ACTINIC KERATOSES Counseling for Topical Chemotherapy Management: Patient exhibits: -  Severe, confluent actinic changes with pre-cancerous actinic keratoses that is secondary to cumulative UV radiation exposure over time - Condition  that is severe; chronic, not at goal. - diffuse scaly erythematous macules and papules with underlying dyspigmentation - Discussed Prescription Field Treatment topical Chemotherapy for Severe, Chronic Confluent Actinic Changes with Pre-Cancerous Actinic Keratoses Field treatment involves treatment of an entire area of skin that has confluent Actinic Changes (Sun/ Ultraviolet light damage) and PreCancerous Actinic Keratoses by method of PhotoDynamic Therapy (PDT) and/or prescription Topical Chemotherapy agents such as 5-fluorouracil, 5-fluorouracil/calcipotriene, and/or imiquimod.  The purpose is to decrease the number of clinically evident and subclinical PreCancerous lesions to prevent progression to development of skin cancer by chemically destroying early precancer changes that may or may not be visible.  It has been shown to reduce the risk of developing skin cancer in the treated area. As a result of treatment, redness, scaling, crusting, and open sores may occur during treatment course. One or more than one of these methods may be used and may have to be used several times to control, suppress and eliminate the PreCancerous changes. Discussed treatment course, expected reaction, and possible side effects. - Recommend daily broad spectrum sunscreen SPF 30+ to sun-exposed areas, reapply every 2 hours as needed.  - Staying in the shade or wearing long sleeves, sun glasses (UVA+UVB protection) and wide brim hats (4-inch brim around the entire circumference of the hat) are also recommended. - Call for new or changing lesions. Repeat 5FU/Calcipotriene treatment on lateral forehead/temples twice a day for 1 week for precancerous lesions.  Consider using 5FU/Calcipotriene to arms in the future.   Return for AK Follow Up in 6-8 months.  I, Jill Parcell, CMA, am acting as scribe for Alm Rhyme, MD.   Documentation: I have reviewed the above documentation for accuracy and completeness, and I agree  with the above.  Alm Rhyme, MD

## 2024-04-28 ENCOUNTER — Encounter: Payer: Self-pay | Admitting: Dermatology

## 2024-08-05 ENCOUNTER — Ambulatory Visit: Payer: Self-pay | Admitting: Family Medicine

## 2024-08-05 LAB — LIPID PANEL
Chol/HDL Ratio: 3.4 ratio (ref 0.0–5.0)
Cholesterol, Total: 166 mg/dL (ref 100–199)
HDL: 49 mg/dL (ref >39)
LDL Chol Calc (NIH): 100 mg/dL — ABNORMAL HIGH (ref 0–99)
Triglycerides: 94 mg/dL (ref 0–149)
VLDL Cholesterol Cal: 17 mg/dL (ref 5–40)

## 2024-08-05 LAB — CBC WITH DIFFERENTIAL/PLATELET
Basophils Absolute: 0 x10E3/uL (ref 0.0–0.2)
Basos: 1 %
EOS (ABSOLUTE): 0.2 x10E3/uL (ref 0.0–0.4)
Eos: 3 %
Hematocrit: 49.5 % (ref 37.5–51.0)
Hemoglobin: 16.7 g/dL (ref 13.0–17.7)
Immature Grans (Abs): 0 x10E3/uL (ref 0.0–0.1)
Immature Granulocytes: 0 %
Lymphocytes Absolute: 1.6 x10E3/uL (ref 0.7–3.1)
Lymphs: 25 %
MCH: 30.4 pg (ref 26.6–33.0)
MCHC: 33.7 g/dL (ref 31.5–35.7)
MCV: 90 fL (ref 79–97)
Monocytes Absolute: 0.6 x10E3/uL (ref 0.1–0.9)
Monocytes: 10 %
Neutrophils Absolute: 3.9 x10E3/uL (ref 1.4–7.0)
Neutrophils: 61 %
Platelets: 137 x10E3/uL — ABNORMAL LOW (ref 150–450)
RBC: 5.5 x10E6/uL (ref 4.14–5.80)
RDW: 12.9 % (ref 11.6–15.4)
WBC: 6.5 x10E3/uL (ref 3.4–10.8)

## 2024-08-05 LAB — B12 AND FOLATE PANEL
Folate: 2.7 ng/mL — ABNORMAL LOW (ref >3.0)
Vitamin B-12: 1156 pg/mL (ref 232–1245)

## 2024-08-05 LAB — COMPREHENSIVE METABOLIC PANEL WITH GFR
ALT: 24 IU/L (ref 0–44)
AST: 28 IU/L (ref 0–40)
Albumin: 4.3 g/dL (ref 3.8–4.8)
Alkaline Phosphatase: 55 IU/L (ref 47–123)
BUN/Creatinine Ratio: 18 (ref 10–24)
BUN: 18 mg/dL (ref 8–27)
Bilirubin Total: 1 mg/dL (ref 0.0–1.2)
CO2: 21 mmol/L (ref 20–29)
Calcium: 9.2 mg/dL (ref 8.6–10.2)
Chloride: 103 mmol/L (ref 96–106)
Creatinine, Ser: 1 mg/dL (ref 0.76–1.27)
Globulin, Total: 3.1 g/dL (ref 1.5–4.5)
Glucose: 104 mg/dL — ABNORMAL HIGH (ref 70–99)
Potassium: 4.4 mmol/L (ref 3.5–5.2)
Sodium: 139 mmol/L (ref 134–144)
Total Protein: 7.4 g/dL (ref 6.0–8.5)
eGFR: 80 mL/min/1.73 (ref >59)

## 2024-08-05 LAB — HEMOGLOBIN A1C
Est. average glucose Bld gHb Est-mCnc: 111 mg/dL
Hgb A1c MFr Bld: 5.5 % (ref 4.8–5.6)

## 2024-08-05 LAB — PSA: Prostate Specific Ag, Serum: 1.6 ng/mL (ref 0.0–4.0)

## 2024-08-05 LAB — VITAMIN D 25 HYDROXY (VIT D DEFICIENCY, FRACTURES): Vit D, 25-Hydroxy: 31.2 ng/mL (ref 30.0–100.0)

## 2024-09-07 ENCOUNTER — Encounter: Admitting: Family Medicine

## 2024-10-13 NOTE — Patient Instructions (Signed)
 Preventive Care 73 Years and Older, Male Preventive care refers to lifestyle choices and visits with your health care provider that can promote health and wellness. Preventive care visits are also called wellness exams. What can I expect for my preventive care visit? Counseling During your preventive care visit, your health care provider may ask about your: Medical history, including: Past medical problems. Family medical history. History of falls. Current health, including: Emotional well-being. Home life and relationship well-being. Sexual activity. Memory and ability to understand (cognition). Lifestyle, including: Alcohol, nicotine or tobacco, and drug use. Access to firearms. Diet, exercise, and sleep habits. Work and work Astronomer. Sunscreen use. Safety issues such as seatbelt and bike helmet use. Physical exam Your health care provider will check your: Height and weight. These may be used to calculate your BMI (body mass index). BMI is a measurement that tells if you are at a healthy weight. Waist circumference. This measures the distance around your waistline. This measurement also tells if you are at a healthy weight and may help predict your risk of certain diseases, such as type 2 diabetes and high blood pressure. Heart rate and blood pressure. Body temperature. Skin for abnormal spots. What immunizations do I need?  Vaccines are usually given at various ages, according to a schedule. Your health care provider will recommend vaccines for you based on your age, medical history, and lifestyle or other factors, such as travel or where you work. What tests do I need? Screening Your health care provider may recommend screening tests for certain conditions. This may include: Lipid and cholesterol levels. Diabetes screening. This is done by checking your blood sugar (glucose) after you have not eaten for a while (fasting). Hepatitis C test. Hepatitis B test. HIV (human  immunodeficiency virus) test. STI (sexually transmitted infection) testing, if you are at risk. Lung cancer screening. Colorectal cancer screening. Prostate cancer screening. Abdominal aortic aneurysm (AAA) screening. You may need this if you are a current or former smoker. Talk with your health care provider about your test results, treatment options, and if necessary, the need for more tests. Follow these instructions at home: Eating and drinking  Eat a diet that includes fresh fruits and vegetables, whole grains, lean protein, and low-fat dairy products. Limit your intake of foods with high amounts of sugar, saturated fats, and salt. Take vitamin and mineral supplements as recommended by your health care provider. Do not drink alcohol if your health care provider tells you not to drink. If you drink alcohol: Limit how much you have to 0-2 drinks a day. Know how much alcohol is in your drink. In the [place] one drink equals one 12 oz bottle of beer (355 mL), one 5 oz glass of wine (148 mL), or one 1 oz glass of hard liquor (44 mL). Lifestyle Brush your teeth every morning and night with fluoride toothpaste. Floss one time each day. Exercise for at least 30 minutes 5 or more days each week. Do not use any products that contain nicotine or tobacco. These products include cigarettes, chewing tobacco, and vaping devices, such as e-cigarettes. If you need help quitting, ask your health care provider. Do not use drugs. If you are sexually active, practice safe sex. Use a condom or other form of protection to prevent STIs. Take aspirin only as told by your health care provider. Make sure that you understand how much to take and what form to take. Work with your health care provider to find out whether it is safe  and beneficial for you to take aspirin daily. Ask your health care provider if you need to take a cholesterol-lowering medicine (statin). Find healthy ways to manage stress, such  as: Meditation, yoga, or listening to music. Journaling. Talking to a trusted person. Spending time with friends and family. Safety Always wear your seat belt while driving or riding in a vehicle. Do not drive: If you have been drinking alcohol. Do not ride with someone who has been drinking. When you are tired or distracted. While texting. If you have been using any mind-altering substances or drugs. Wear a helmet and other protective equipment during sports activities. If you have firearms in your house, make sure you follow all gun safety procedures. Minimize exposure to UV radiation to reduce your risk of skin cancer. What's next? Visit your health care provider once a year for an annual wellness visit. Ask your health care provider how often you should have your eyes and teeth checked. Stay up to date on all vaccines. This information is not intended to replace advice given to you by your health care provider. Make sure you discuss any questions you have with your health care provider. Document Revised: 03/13/2021 Document Reviewed: 03/13/2021 Elsevier Patient Education  2024 [employer].

## 2024-10-17 ENCOUNTER — Encounter: Payer: Self-pay | Admitting: Family Medicine

## 2024-10-17 ENCOUNTER — Ambulatory Visit: Admitting: Family Medicine

## 2024-10-17 VITALS — BP 116/70 | HR 90 | Resp 16 | Ht 72.0 in | Wt 231.4 lb

## 2024-10-17 DIAGNOSIS — I482 Chronic atrial fibrillation, unspecified: Secondary | ICD-10-CM

## 2024-10-17 DIAGNOSIS — E559 Vitamin D deficiency, unspecified: Secondary | ICD-10-CM

## 2024-10-17 DIAGNOSIS — Z1211 Encounter for screening for malignant neoplasm of colon: Secondary | ICD-10-CM | POA: Diagnosis not present

## 2024-10-17 DIAGNOSIS — E7800 Pure hypercholesterolemia, unspecified: Secondary | ICD-10-CM

## 2024-10-17 DIAGNOSIS — Z0001 Encounter for general adult medical examination with abnormal findings: Secondary | ICD-10-CM

## 2024-10-17 DIAGNOSIS — I5022 Chronic systolic (congestive) heart failure: Secondary | ICD-10-CM

## 2024-10-17 DIAGNOSIS — I872 Venous insufficiency (chronic) (peripheral): Secondary | ICD-10-CM | POA: Diagnosis not present

## 2024-10-17 DIAGNOSIS — E538 Deficiency of other specified B group vitamins: Secondary | ICD-10-CM | POA: Diagnosis not present

## 2024-10-17 DIAGNOSIS — Z0000 Encounter for general adult medical examination without abnormal findings: Secondary | ICD-10-CM

## 2024-10-17 MED ORDER — FOLIC ACID 1 MG PO TABS: 1.0000 mg | ORAL_TABLET | Freq: Every day | ORAL | 3 refills | Status: AC

## 2024-10-17 NOTE — Progress Notes (Signed)
 Name: James Newman   MRN: [id number]    DOB: Nov 09, 1950   Date:10/17/2024       Progress Note  Subjective  Chief Complaint  Chief Complaint  Patient presents with   Annual Exam    HPI  Patient presents for annual CPE and follow up  Discussed the use of AI scribe software for clinical note transcription with the patient, who gave verbal consent to proceed.  History of Present Illness James Newman is a 74 year old male with chronic systolic heart failure and chronic atrial fibrillation who presents for a physical and follow-up visit.  He has chronic systolic heart failure and chronic atrial fibrillation. No symptoms of atrial fibrillation such as palpitations or shortness of breath. He takes Coumadin  for anticoagulation and metoprolol  50 mg for rate control. His personal device recorded his heart rate at 82 beats per minute. He takes furosemide  daily for heart failure management and reports no swelling in his legs or shortness of breath when lying flat. He is able to be physically active without significant shortness of breath.  He has a history of high cholesterol and takes rosuvastatin  5 mg daily. No muscle aches associated with this medication. His recent lab work showed an LDL cholesterol level slightly elevated at 100 mg/dL, higher than his usual 80s to 90s range.  His recent lab work showed a low folic acid  level at 2.7. His B12 levels have improved, and he continues B12 supplementation. His blood sugar was 104, and his A1c has been normal. Kidney and liver functions were normal. Vitamin D  levels were good, and PSA was normal.  He is seeing a dermatologist for facial skin issues and uses a chemical peel treatment.  He reports feeling better than he has in the past and maintains a low-carb diet, consuming around 25 carbs per day. He is physically active, achieving over 6,000 steps a day at work, and engages in activities such as playing music and moving equipment.  He recently visited  an eye doctor for the first time in 50 years after experiencing redness in his left eye. The eye doctor found mild cataracts but no significant issues, and his vision was good without the need for glasses, except for reading small print.  He has not been sexually active for some time and does not have legal documents for medical decision-making, but his wife is aware of his wishes. He has never smoked and is not interested in receiving vaccines at this time, citing past experiences in [employer] where he received numerous vaccinations.        IPSS     Row Name 10/17/24 1518         International Prostate Symptom Score   How often have you had the sensation of not emptying your bladder? Not at All     How often have you had to urinate less than every two hours? Less than 1 in 5 times  Fluid pill     How often have you found you stopped and started again several times when you urinated? Not at All     How often have you found it difficult to postpone urination? Not at All     How often have you had a weak urinary stream? Not at All     How often have you had to strain to start urination? Not at All     How many times did you typically get up at night to urinate? 1 Time  Total IPSS Score 2       Quality of Life due to urinary symptoms   If you were to spend the rest of your life with your urinary condition just the way it is now how would you feel about that? Pleased        Diet: low carbohydrate diet  Exercise: more during the warm months  Last Dental Exam: up to date  Last Eye Exam: up to date   Depression: phq 9 is negative    10/17/2024    3:18 PM 04/12/2024    3:14 PM 09/02/2023    8:45 AM 03/27/2022    8:30 AM 10/07/2021    8:25 AM  Depression screen PHQ 2/9  Decreased Interest 0 0 0 0 0  Down, Depressed, Hopeless 0 0 0 0 0  PHQ - 2 Score 0 0 0 0 0  Altered sleeping   0 0 0  Tired, decreased energy   0 0 0  Change in appetite   0 0 0  Feeling bad or failure about  yourself    0 0 0  Trouble concentrating   0 0 0  Moving slowly or fidgety/restless   0 0 0  Suicidal thoughts   0 0 0  PHQ-9 Score   0  0  0   Difficult doing work/chores   Not difficult at all  Not difficult at all     Data saved with a previous flowsheet row definition    Hypertension:  BP Readings from Last 3 Encounters:  10/17/24 116/70  04/12/24 118/76  09/02/23 118/78    Obesity: Wt Readings from Last 3 Encounters:  10/17/24 231 lb 6.4 oz (105 kg)  04/12/24 229 lb 12.8 oz (104.2 kg)  09/02/23 223 lb 4.8 oz (101.3 kg)   BMI Readings from Last 3 Encounters:  10/17/24 31.38 kg/m  04/12/24 31.17 kg/m  09/02/23 30.28 kg/m     Flowsheet Row Office Visit from 10/17/2024 in [facility]  AUDIT-C Score 2      Married STD testing and prevention (HIV/chl/gon/syphilis):  not applicable Sexual history: not in a while  Hep C Screening: completed Skin cancer: Discussed monitoring for atypical lesions - under the care of dermatologist  Colorectal cancer: he is willing to have colonoscopy done this year  Prostate cancer: PSA normal    Lung cancer:  Low Dose CT Chest recommended if Age 95-80 years, 30 pack-year currently smoking OR have quit w/in 15years. Patient  is not a candidate for screening   AAA: The USPSTF recommends one-time screening with ultrasonography in men ages 73 to 75 years who have ever smoked. Patient   is not a candidate for screening  ECG:  sees cardiologist   Vaccines: reviewed with the patient.   Advanced Care Planning: A voluntary discussion about advance care planning including the explanation and discussion of advance directives.  Discussed health care proxy and Living will, and the patient was able to identify a health care proxy as wife .  Patient does not have a living will and power of attorney of health care   Patient Active Problem List   Diagnosis Date Noted   Venous stasis dermatitis of both lower extremities  04/13/2024   Pure hypercholesterolemia 04/13/2024   Bradycardia 10/30/2020   Moderate mitral insufficiency 11/03/2016   Chronic systolic (congestive) heart failure (HCC) 03/20/2016   Seasonal affective disorder 11/22/2015   Purpura senilis 11/22/2015   Encounter for therapeutic drug level monitoring 03/14/2015  Atypical nevus 03/14/2015   B12 deficiency 03/14/2015   Dyslipidemia 03/14/2015   Tension headache 03/14/2015   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   Obesity (BMI 30-39.9) 03/14/2015   Vitamin D  deficiency 03/14/2015   Chronic a-fib (HCC) 03/08/2007    Past Surgical History:  Procedure Laterality Date   NASAL SEPTUM SURGERY      Family History  Problem Relation Age of Onset   COPD Mother    Hypertension Son    Obesity Son     Social History   Socioeconomic History   Marital status: Married    Spouse name: Romero   Number of children: 1   Years of education: Not on file   Highest education level: Associate degree: occupational, scientist, product/process development, or vocational program  Occupational History   Not on file  Tobacco Use   Smoking status: Never   Smokeless tobacco: Never  Vaping Use   Vaping status: Never Used  Substance and Sexual Activity   Alcohol use: Yes    Alcohol/week: 2.0 standard drinks of alcohol    Types: 2 Standard drinks or equivalent per week   Drug use: Never   Sexual activity: Not Currently    Partners: Female  Other Topics Concern   Not on file  Social History Narrative   Not on file   Social Drivers of Health   Tobacco Use: Low Risk (10/17/2024)   Patient History    Smoking Tobacco Use: Never    Smokeless Tobacco Use: Never    Passive Exposure: Not on file  Financial Resource Strain: Low Risk (10/13/2024)   Overall Financial Resource Strain (CARDIA)    Difficulty of Paying Living Expenses: Not hard at all  Food Insecurity: No Food Insecurity (10/13/2024)   Epic    Worried About Programme Researcher, Broadcasting/film/video  in the Last Year: Never true    Ran Out of Food in the Last Year: Never true  Transportation Needs: No Transportation Needs (10/13/2024)   Epic    Lack of Transportation (Medical): No    Lack of Transportation (Non-Medical): No  Physical Activity: Insufficiently Active (10/13/2024)   Exercise Vital Sign    Days of Exercise per Week: 4 days    Minutes of Exercise per Session: 30 min  Stress: No Stress Concern Present (10/13/2024)   [employer] of Occupational Health - Occupational Stress Questionnaire    Feeling of Stress: Not at all  Social Connections: Socially Integrated (10/13/2024)   Social Connection and Isolation Panel    Frequency of Communication with Friends and Family: Once a week    Frequency of Social Gatherings with Friends and Family: Three times a week    Attends Religious Services: More than 4 times per year    Active Member of Clubs or Organizations: Yes    Attends Banker Meetings: More than 4 times per year    Marital Status: Married  Catering Manager Violence: Not At Risk (10/17/2024)   Epic    Fear of Current or Ex-Partner: No    Emotionally Abused: No    Physically Abused: No    Sexually Abused: No  Depression (PHQ2-9): Low Risk (10/17/2024)   Depression (PHQ2-9)    PHQ-2 Score: 0  Alcohol Screen: Low Risk (10/13/2024)   Alcohol Screen    Last Alcohol Screening Score (AUDIT): 2  Housing: Low Risk (10/13/2024)   Epic    Unable to Pay for Housing in the Last Year: No    Number of  Times Moved in the Last Year: 0    Homeless in the Last Year: No  Utilities: Not At Risk (10/17/2024)   Epic    Threatened with loss of utilities: No  Health Literacy: Adequate Health Literacy (10/17/2024)   B1300 Health Literacy    Frequency of need for help with medical instructions: Never    Current Medications[1]  Allergies[2]   ROS  Constitutional: Negative for fever or weight change.  Respiratory: Negative for cough and shortness of breath.    Cardiovascular: Negative for chest pain or palpitations.  Gastrointestinal: Negative for abdominal pain, no bowel changes.  Musculoskeletal: Negative for gait problem or joint swelling.  Skin: Negative for rash.  Neurological: Negative for dizziness or headache.  No other specific complaints in a complete review of systems (except as listed in HPI above).    Objective  Vitals:   10/17/24 1520  BP: 116/70  Pulse: 90  Resp: 16  SpO2: 98%  Weight: 231 lb 6.4 oz (105 kg)  Height: 6' (1.829 m)    Body mass index is 31.38 kg/m.  Physical Exam  Constitutional: Patient appears well-developed and well-nourished. No distress.  HENT: Head: Normocephalic and atraumatic. Ears: B TMs ok, no erythema or effusion; Nose: Nose normal. Mouth/Throat: Oropharynx is clear and moist. No oropharyngeal exudate.  Eyes: Conjunctivae and EOM are normal. Pupils are equal, round, and reactive to light. No scleral icterus.  Neck: Normal range of motion. Neck supple. No JVD present. No thyromegaly present.  Cardiovascular: Normal rate, regular rhythm and normal heart sounds.  No murmur heard. No BLE edema. Pulmonary/Chest: Effort normal and breath sounds normal. No respiratory distress. Abdominal: Soft. Bowel sounds are normal, no distension. There is no tenderness. no masses MALE GENITALIA:not interested  RECTAL: patient asked me not to do the exam  Musculoskeletal: Normal range of motion, no joint effusions. No gross deformities Neurological: he is alert and oriented to person, place, and time. No cranial nerve deficit. Coordination, balance, strength, speech and gait are normal.  Skin: Skin is warm and dry. Stasis dermatitis both lower extremities  Psychiatric: Patient has a normal mood and affect. behavior is normal. Judgment and thought content normal.      Assessment & Plan Annual physical examination and preventive care Discussed preventive care measures including vaccinations and screenings. He  is not currently receiving vaccinations due to personal preference. Discussed the importance of tetanus, pneumonia, and shingles vaccines, especially given his age and potential for hospitalization with pneumonia. - Recommended tetanus vaccine (TDAP). - Recommended pneumonia vaccine (PCV20). - Recommended shingles vaccine. - Encouraged colonoscopy screening.  Chronic systolic heart failure Well-controlled with no symptoms. - Continue furosemide  daily.  Chronic atrial fibrillation Asymptomatic with well-controlled heart rate on metoprolol . Discussed potential switch from Coumadin  to Xarelto due to cost of INR monitoring. - Continue metoprolol  for rate control. - Discuss potential switch from Coumadin  to Xarelto with cardiologist.  Pure hypercholesterolemia Managed with rosuvastatin  5 mg daily. LDL slightly elevated but acceptable. - Continue rosuvastatin  5 mg daily.  Folic acid  deficiency Folic acid  level low at 2.7. Discussed importance of supplementation to prevent complications. - Prescribed folic acid  supplement for 90 days daily, then reduce to three times a week.  Vitamin B12 deficiency Vitamin B12 levels improved. No anemia present. - Continue B12 supplementation.  Vitamin D  deficiency Vitamin D  levels adequate with current supplementation. - Continue vitamin D  supplementation.  Screening for colon cancer Due for colonoscopy screening. Last colonoscopy in 2013. Discussed importance of screening before insurance  changes. - Encouraged scheduling of colonoscopy.     -Prostate cancer screening and PSA options (with potential risks and benefits of testing vs not testing) were discussed along with recent recs/guidelines. -USPSTF grade A and B recommendations reviewed with patient; age-appropriate recommendations, preventive care, screening tests, etc discussed and encouraged; healthy living encouraged; see AVS for patient education given to patient -Discussed importance of 150  minutes of physical activity weekly, eat two servings of fish weekly, eat one serving of tree nuts ( cashews, pistachios, pecans, almonds.SABRA) every other day, eat 6 servings of fruit/vegetables daily and drink plenty of water and avoid sweet beverages.  -Reviewed Health Maintenance: yes     [1]  Current Outpatient Medications:    cholecalciferol (VITAMIN D ) 1000 units tablet, Take 1,000 Units by mouth daily., Disp: , Rfl:    furosemide  (LASIX ) 40 MG tablet, Take 1 tablet (40 mg total) by mouth daily., Disp: 30 tablet, Rfl: 5   metoprolol  succinate (TOPROL -XL) 50 MG 24 hr tablet, Take 1 tablet (50 mg total) by mouth daily. Take with or immediately following a meal., Disp: 30 tablet, Rfl: 5   rosuvastatin  (CRESTOR ) 5 MG tablet, Take 5 mg by mouth daily., Disp: , Rfl:    vitamin B-12 (CYANOCOBALAMIN ) 1000 MCG tablet, Take 1,000 mcg by mouth daily., Disp: , Rfl:    warfarin (COUMADIN ) 5 MG tablet, Take 1 tablet (5 mg total) by mouth daily. Take one tablet by mouth daily, Disp: 45 tablet, Rfl: 5 [2]  Allergies Allergen Reactions   Diltiazem  Cough   Lisinopril  Cough

## 2024-12-21 ENCOUNTER — Ambulatory Visit: Admitting: Dermatology

## 2025-04-14 ENCOUNTER — Ambulatory Visit: Admitting: Family Medicine

## 2025-10-19 ENCOUNTER — Encounter: Admitting: Family Medicine
